# Patient Record
Sex: Male | Born: 1975 | Race: Black or African American | Hispanic: No | Marital: Married | Smoking: Never smoker
Health system: Southern US, Community
[De-identification: ages and names within clinical notes are randomized; demographics above are authoritative.]

## PROBLEM LIST (undated history)

## (undated) DIAGNOSIS — E119 Type 2 diabetes mellitus without complications: Secondary | ICD-10-CM

## (undated) DIAGNOSIS — I10 Essential (primary) hypertension: Secondary | ICD-10-CM

## (undated) DIAGNOSIS — E669 Obesity, unspecified: Secondary | ICD-10-CM

## (undated) HISTORY — PX: CHOLECYSTECTOMY: SHX55

---

## 2019-05-06 ENCOUNTER — Inpatient Hospital Stay
Admission: EM | Admit: 2019-05-06 | Discharge: 2019-05-16 | DRG: 308 | Disposition: A | Discharge status: Home / Self Care | Payer: BC Managed Care – PPO | Attending: Internal Medicine | Admitting: Internal Medicine

## 2019-05-06 ENCOUNTER — Emergency Department: Payer: BC Managed Care – PPO

## 2019-05-06 ENCOUNTER — Encounter: Payer: Self-pay | Admitting: Emergency Medicine

## 2019-05-06 ENCOUNTER — Ambulatory Visit
Admission: EM | Admit: 2019-05-06 | Discharge: 2019-05-06 | Disposition: A | Discharge status: Home / Self Care | Payer: BC Managed Care – PPO | Source: Home / Self Care | Attending: Family Medicine | Admitting: Family Medicine

## 2019-05-06 ENCOUNTER — Other Ambulatory Visit: Payer: Self-pay

## 2019-05-06 DIAGNOSIS — N170 Acute kidney failure with tubular necrosis: Secondary | ICD-10-CM | POA: Diagnosis not present

## 2019-05-06 DIAGNOSIS — B9620 Unspecified Escherichia coli [E. coli] as the cause of diseases classified elsewhere: Secondary | ICD-10-CM | POA: Diagnosis not present

## 2019-05-06 DIAGNOSIS — I959 Hypotension, unspecified: Secondary | ICD-10-CM | POA: Diagnosis not present

## 2019-05-06 DIAGNOSIS — Z6841 Body Mass Index (BMI) 40.0 and over, adult: Secondary | ICD-10-CM

## 2019-05-06 DIAGNOSIS — R079 Chest pain, unspecified: Secondary | ICD-10-CM | POA: Diagnosis present

## 2019-05-06 DIAGNOSIS — Z23 Encounter for immunization: Secondary | ICD-10-CM

## 2019-05-06 DIAGNOSIS — E785 Hyperlipidemia, unspecified: Secondary | ICD-10-CM | POA: Diagnosis present

## 2019-05-06 DIAGNOSIS — K5900 Constipation, unspecified: Secondary | ICD-10-CM | POA: Diagnosis not present

## 2019-05-06 DIAGNOSIS — R000 Tachycardia, unspecified: Secondary | ICD-10-CM

## 2019-05-06 DIAGNOSIS — I209 Angina pectoris, unspecified: Secondary | ICD-10-CM | POA: Diagnosis not present

## 2019-05-06 DIAGNOSIS — I5031 Acute diastolic (congestive) heart failure: Secondary | ICD-10-CM | POA: Diagnosis not present

## 2019-05-06 DIAGNOSIS — I5021 Acute systolic (congestive) heart failure: Secondary | ICD-10-CM | POA: Diagnosis present

## 2019-05-06 DIAGNOSIS — R778 Other specified abnormalities of plasma proteins: Secondary | ICD-10-CM | POA: Diagnosis not present

## 2019-05-06 DIAGNOSIS — Z20828 Contact with and (suspected) exposure to other viral communicable diseases: Secondary | ICD-10-CM | POA: Diagnosis present

## 2019-05-06 DIAGNOSIS — I5043 Acute on chronic combined systolic (congestive) and diastolic (congestive) heart failure: Secondary | ICD-10-CM | POA: Diagnosis present

## 2019-05-06 DIAGNOSIS — G44019 Episodic cluster headache, not intractable: Secondary | ICD-10-CM | POA: Diagnosis not present

## 2019-05-06 DIAGNOSIS — I10 Essential (primary) hypertension: Secondary | ICD-10-CM | POA: Diagnosis not present

## 2019-05-06 DIAGNOSIS — N182 Chronic kidney disease, stage 2 (mild): Secondary | ICD-10-CM | POA: Diagnosis not present

## 2019-05-06 DIAGNOSIS — J9601 Acute respiratory failure with hypoxia: Secondary | ICD-10-CM | POA: Diagnosis present

## 2019-05-06 DIAGNOSIS — Z79899 Other long term (current) drug therapy: Secondary | ICD-10-CM | POA: Diagnosis not present

## 2019-05-06 DIAGNOSIS — R519 Headache, unspecified: Secondary | ICD-10-CM | POA: Diagnosis present

## 2019-05-06 DIAGNOSIS — J9620 Acute and chronic respiratory failure, unspecified whether with hypoxia or hypercapnia: Secondary | ICD-10-CM | POA: Diagnosis not present

## 2019-05-06 DIAGNOSIS — Z823 Family history of stroke: Secondary | ICD-10-CM

## 2019-05-06 DIAGNOSIS — N390 Urinary tract infection, site not specified: Secondary | ICD-10-CM | POA: Diagnosis present

## 2019-05-06 DIAGNOSIS — G4733 Obstructive sleep apnea (adult) (pediatric): Secondary | ICD-10-CM | POA: Diagnosis present

## 2019-05-06 DIAGNOSIS — J9621 Acute and chronic respiratory failure with hypoxia: Secondary | ICD-10-CM | POA: Diagnosis present

## 2019-05-06 DIAGNOSIS — I4892 Unspecified atrial flutter: Secondary | ICD-10-CM | POA: Diagnosis not present

## 2019-05-06 DIAGNOSIS — R7989 Other specified abnormal findings of blood chemistry: Secondary | ICD-10-CM | POA: Diagnosis present

## 2019-05-06 DIAGNOSIS — R140 Abdominal distension (gaseous): Secondary | ICD-10-CM

## 2019-05-06 DIAGNOSIS — Z8249 Family history of ischemic heart disease and other diseases of the circulatory system: Secondary | ICD-10-CM

## 2019-05-06 DIAGNOSIS — I130 Hypertensive heart and chronic kidney disease with heart failure and stage 1 through stage 4 chronic kidney disease, or unspecified chronic kidney disease: Secondary | ICD-10-CM | POA: Diagnosis present

## 2019-05-06 DIAGNOSIS — I4891 Unspecified atrial fibrillation: Secondary | ICD-10-CM | POA: Diagnosis present

## 2019-05-06 DIAGNOSIS — N3000 Acute cystitis without hematuria: Secondary | ICD-10-CM | POA: Diagnosis not present

## 2019-05-06 DIAGNOSIS — B952 Enterococcus as the cause of diseases classified elsewhere: Secondary | ICD-10-CM | POA: Diagnosis not present

## 2019-05-06 DIAGNOSIS — R609 Edema, unspecified: Secondary | ICD-10-CM

## 2019-05-06 DIAGNOSIS — R0602 Shortness of breath: Secondary | ICD-10-CM

## 2019-05-06 DIAGNOSIS — K219 Gastro-esophageal reflux disease without esophagitis: Secondary | ICD-10-CM | POA: Diagnosis present

## 2019-05-06 DIAGNOSIS — N179 Acute kidney failure, unspecified: Secondary | ICD-10-CM | POA: Diagnosis not present

## 2019-05-06 DIAGNOSIS — J984 Other disorders of lung: Secondary | ICD-10-CM

## 2019-05-06 DIAGNOSIS — G44029 Chronic cluster headache, not intractable: Secondary | ICD-10-CM

## 2019-05-06 HISTORY — DX: Essential (primary) hypertension: I10

## 2019-05-06 HISTORY — DX: Obesity, unspecified: E66.9

## 2019-05-06 LAB — URINE DRUG SCREEN, QUALITATIVE (ARMC ONLY)
Amphetamines, Ur Screen: NOT DETECTED
Barbiturates, Ur Screen: NOT DETECTED
Benzodiazepine, Ur Scrn: NOT DETECTED
Cannabinoid 50 Ng, Ur ~~LOC~~: NOT DETECTED
Cocaine Metabolite,Ur ~~LOC~~: NOT DETECTED
MDMA (Ecstasy)Ur Screen: NOT DETECTED
Methadone Scn, Ur: NOT DETECTED
Opiate, Ur Screen: NOT DETECTED
Phencyclidine (PCP) Ur S: NOT DETECTED
Tricyclic, Ur Screen: NOT DETECTED

## 2019-05-06 LAB — BASIC METABOLIC PANEL
Anion gap: 11 (ref 5–15)
BUN: 18 mg/dL (ref 6–20)
CO2: 24 mmol/L (ref 22–32)
Calcium: 9 mg/dL (ref 8.9–10.3)
Chloride: 103 mmol/L (ref 98–111)
Creatinine, Ser: 1.32 mg/dL — ABNORMAL HIGH (ref 0.61–1.24)
GFR calc Af Amer: >60 mL/min (ref >60)
GFR calc non Af Amer: >60 mL/min (ref >60)
Glucose, Bld: 111 mg/dL — ABNORMAL HIGH (ref 70–99)
Potassium: 3.7 mmol/L (ref 3.5–5.1)
Sodium: 138 mmol/L (ref 135–145)

## 2019-05-06 LAB — CBC WITH DIFFERENTIAL/PLATELET
Abs Immature Granulocytes: 0.01 10*3/uL (ref 0.00–0.07)
Basophils Absolute: 0 10*3/uL (ref 0.0–0.1)
Basophils Relative: 0 %
Eosinophils Absolute: 0.1 10*3/uL (ref 0.0–0.5)
Eosinophils Relative: 3 %
HCT: 46 % (ref 39.0–52.0)
Hemoglobin: 14.7 g/dL (ref 13.0–17.0)
Immature Granulocytes: 0 %
Lymphocytes Relative: 29 %
Lymphs Abs: 1.3 10*3/uL (ref 0.7–4.0)
MCH: 29.2 pg (ref 26.0–34.0)
MCHC: 32 g/dL (ref 30.0–36.0)
MCV: 91.5 fL (ref 80.0–100.0)
Monocytes Absolute: 0.3 10*3/uL (ref 0.1–1.0)
Monocytes Relative: 6 %
Neutro Abs: 2.9 10*3/uL (ref 1.7–7.7)
Neutrophils Relative %: 62 %
Platelets: 211 10*3/uL (ref 150–400)
RBC: 5.03 MIL/uL (ref 4.22–5.81)
RDW: 12.1 % (ref 11.5–15.5)
WBC: 4.7 10*3/uL (ref 4.0–10.5)
nRBC: 0 % (ref 0.0–0.2)

## 2019-05-06 LAB — SARS CORONAVIRUS 2 BY RT PCR (HOSPITAL ORDER, PERFORMED IN ~~LOC~~ HOSPITAL LAB): SARS Coronavirus 2: NEGATIVE

## 2019-05-06 LAB — TSH: TSH: 2.001 u[IU]/mL (ref 0.350–4.500)

## 2019-05-06 LAB — PROTIME-INR
INR: 1.3 — ABNORMAL HIGH (ref 0.8–1.2)
Prothrombin Time: 15.9 seconds — ABNORMAL HIGH (ref 11.4–15.2)

## 2019-05-06 LAB — MAGNESIUM: Magnesium: 2 mg/dL (ref 1.7–2.4)

## 2019-05-06 LAB — APTT: aPTT: 132 seconds — ABNORMAL HIGH (ref 24–36)

## 2019-05-06 LAB — GLUCOSE, CAPILLARY: Glucose-Capillary: 96 mg/dL (ref 70–99)

## 2019-05-06 LAB — TROPONIN I (HIGH SENSITIVITY)
Troponin I (High Sensitivity): 63 ng/L — ABNORMAL HIGH (ref <18)
Troponin I (High Sensitivity): 62 ng/L — ABNORMAL HIGH (ref <18)

## 2019-05-06 LAB — MRSA PCR SCREENING: MRSA by PCR: NEGATIVE

## 2019-05-06 LAB — BRAIN NATRIURETIC PEPTIDE: B Natriuretic Peptide: 102 pg/mL — ABNORMAL HIGH (ref 0.0–100.0)

## 2019-05-06 MED ORDER — HEPARIN BOLUS VIA INFUSION
5000.0000 [IU] | Freq: Once | INTRAVENOUS | Status: AC
  Administered 2019-05-06: 5000 [IU] via INTRAVENOUS
  Filled 2019-05-06: qty 5000

## 2019-05-06 MED ORDER — ACETAMINOPHEN 650 MG RE SUPP: 650.0000 mg | Freq: Four times a day (QID) | RECTAL | Status: DC | PRN

## 2019-05-06 MED ORDER — INFLUENZA VAC SPLIT QUAD 0.5 ML IM SUSY
0.5000 mL | PREFILLED_SYRINGE | INTRAMUSCULAR | Status: AC
  Administered 2019-05-07: 0.5 mL via INTRAMUSCULAR
  Filled 2019-05-06: qty 0.5

## 2019-05-06 MED ORDER — ONDANSETRON HCL 4 MG PO TABS: 4.0000 mg | ORAL_TABLET | Freq: Four times a day (QID) | ORAL | Status: DC | PRN

## 2019-05-06 MED ORDER — DILTIAZEM HCL 25 MG/5ML IV SOLN
10.0000 mg | Freq: Once | INTRAVENOUS | Status: AC
  Administered 2019-05-06: 15:00:00 10 mg via INTRAVENOUS

## 2019-05-06 MED ORDER — DILTIAZEM HCL 25 MG/5ML IV SOLN
20.0000 mg | Freq: Once | INTRAVENOUS | Status: AC
  Administered 2019-05-06: 17:00:00 20 mg via INTRAVENOUS

## 2019-05-06 MED ORDER — ACETAMINOPHEN 325 MG PO TABS: 650.0000 mg | ORAL_TABLET | Freq: Four times a day (QID) | ORAL | Status: DC | PRN

## 2019-05-06 MED ORDER — DILTIAZEM HCL 100 MG IV SOLR: 5.0000 mg/h | INTRAVENOUS | Status: DC

## 2019-05-06 MED ORDER — DILTIAZEM HCL 25 MG/5ML IV SOLN: 10.0000 mg | Freq: Once | INTRAVENOUS | Status: DC

## 2019-05-06 MED ORDER — DILTIAZEM HCL 25 MG/5ML IV SOLN
5.0000 mg | Freq: Once | INTRAVENOUS | Status: AC
  Administered 2019-05-06: 5 mg via INTRAVENOUS
  Filled 2019-05-06: qty 5

## 2019-05-06 MED ORDER — DILTIAZEM HCL 100 MG IV SOLR
5.0000 mg/h | INTRAVENOUS | Status: DC
  Administered 2019-05-06: 15 mg/h via INTRAVENOUS
  Administered 2019-05-06: 5 mg/h via INTRAVENOUS
  Administered 2019-05-07: 7.5 mg/h via INTRAVENOUS
  Administered 2019-05-07 (×2): 15 mg/h via INTRAVENOUS
  Administered 2019-05-08: 5 mg/h via INTRAVENOUS
  Filled 2019-05-06 (×6): qty 100

## 2019-05-06 MED ORDER — LOSARTAN POTASSIUM 50 MG PO TABS
50.0000 mg | ORAL_TABLET | Freq: Every day | ORAL | Status: DC
  Administered 2019-05-06 – 2019-05-09 (×4): 50 mg via ORAL
  Filled 2019-05-06 (×4): qty 1

## 2019-05-06 MED ORDER — ONDANSETRON HCL 4 MG/2ML IJ SOLN
4.0000 mg | Freq: Four times a day (QID) | INTRAMUSCULAR | Status: DC | PRN
  Administered 2019-05-08 – 2019-05-09 (×2): 4 mg via INTRAVENOUS
  Filled 2019-05-06 (×2): qty 2

## 2019-05-06 MED ORDER — HYDROCHLOROTHIAZIDE 25 MG PO TABS
25.0000 mg | ORAL_TABLET | Freq: Every day | ORAL | Status: DC
  Administered 2019-05-06 – 2019-05-09 (×4): 25 mg via ORAL
  Filled 2019-05-06 (×4): qty 1

## 2019-05-06 MED ORDER — METOPROLOL TARTRATE 5 MG/5ML IV SOLN
5.0000 mg | Freq: Four times a day (QID) | INTRAVENOUS | Status: DC | PRN
  Administered 2019-05-06 – 2019-05-09 (×3): 5 mg via INTRAVENOUS
  Filled 2019-05-06 (×3): qty 5

## 2019-05-06 MED ORDER — SODIUM CHLORIDE 0.9% FLUSH
3.0000 mL | Freq: Two times a day (BID) | INTRAVENOUS | Status: DC
  Administered 2019-05-06 – 2019-05-16 (×16): 3 mL via INTRAVENOUS

## 2019-05-06 MED ORDER — AMLODIPINE BESYLATE 5 MG PO TABS
5.0000 mg | ORAL_TABLET | Freq: Every day | ORAL | Status: DC
  Administered 2019-05-06 – 2019-05-09 (×4): 5 mg via ORAL
  Filled 2019-05-06 (×4): qty 1

## 2019-05-06 MED ORDER — ASPIRIN 81 MG PO CHEW
324.0000 mg | CHEWABLE_TABLET | Freq: Once | ORAL | Status: AC
  Administered 2019-05-06: 324 mg via ORAL

## 2019-05-06 MED ORDER — POLYETHYLENE GLYCOL 3350 17 G PO PACK
17.0000 g | PACK | Freq: Every day | ORAL | Status: DC | PRN
  Administered 2019-05-09 (×2): 17 g via ORAL
  Filled 2019-05-06 (×2): qty 1

## 2019-05-06 MED ORDER — HEPARIN (PORCINE) 25000 UT/250ML-% IV SOLN
2000.0000 [IU]/h | INTRAVENOUS | Status: DC
  Administered 2019-05-06 – 2019-05-08 (×4): 2000 [IU]/h via INTRAVENOUS
  Filled 2019-05-06 (×4): qty 250

## 2019-05-06 NOTE — ED Triage Notes (Signed)
Pt to ED via [facility] from urgent care for chief complaint of cluster headaches, tachycardia and concerning EKG.  C/o [facility] [place] that started this past weekend with exertion and h/a that started Friday. Pt appears in NAD at this time.

## 2019-05-06 NOTE — ED Provider Notes (Signed)
[facility] Emergency Department Provider Note ____________________________________________   First MD Initiated Contact with Patient 05/06/19 1420     (approximate)  I have reviewed the triage vital signs and the nursing notes.   HISTORY  Chief Complaint Headache, Tachycardia, and Shortness of Breath    HPI Joshua Sampson is a 43 y.o. male with PMH as noted below who presents from urgent care after he was noted to be significantly tachycardic and and likely atrial flutter.  The patient states he initially went to the urgent care because of cluster headaches which have been happening about every 1-2 hours and lasting for a few minutes.  He states he has had episodes like this about every 2 years, and it feels similar to prior cluster headaches.  It is mainly in the right frontotemporal region.  However, in urgent care the patient was noted to have a heart rate in the 140s and an EKG showing atrial flutter and possible MI.  The patient states that he has had some palpitations intermittently over the last few days.  He had some chest pressure when laying down yesterday, but none currently.  He has no active chest pain at this time.  He states he has had exertional shortness of breath for some time.  She denies any leg swelling.  He has no prior history of atrial fibrillation or flutter.   Past Medical History:  Diagnosis Date  . Hypertension   . Obesity     There are no active problems to display for this patient.   Past Surgical History:  Procedure Laterality Date  . CHOLECYSTECTOMY      Prior to Admission medications   Medication Sig Start Date End Date Taking? Authorizing Provider  amLODipine (NORVASC) 5 MG tablet Take 5 mg by mouth daily. 04/26/19   [provider]  hydrochlorothiazide (HYDRODIURIL) 25 MG tablet TAKE 1 TABLET BY MOUTH DAILY 08/30/18   [provider]  losartan (COZAAR) 50 MG tablet Take by mouth. 09/02/17   [provider]    Allergies Patient has no known allergies.  Family History  Problem Relation Age of Onset  . Hypertension Mother   . Hypertension Father   . Stroke Brother     Social History Social History   Tobacco Use  . Smoking status: Never Smoker  . Smokeless tobacco: Never Used  Substance Use Topics  . Alcohol use: Not Currently  . Drug use: Never    Review of Systems  Constitutional: No fever. Eyes: No visual changes. ENT: No sore throat. Cardiovascular: Denies chest pain. Respiratory: Positive for intermittent shortness of breath. Gastrointestinal: No vomiting or diarrhea.  Genitourinary: Negative for flank pain. Musculoskeletal: Negative for back pain. Skin: Negative for rash. Neurological: Positive for intermittent headache.   ____________________________________________   PHYSICAL EXAM:  VITAL SIGNS: ED Triage Vitals  Enc Vitals Group     BP --      Pulse Rate 05/06/19 1424 (!) 148     Resp 05/06/19 1424 16     Temp 05/06/19 1424 98.1 F (36.7 C)     Temp Source 05/06/19 1424 Oral     SpO2 05/06/19 1424 97 %     Weight 05/06/19 1427 (!) 489 lb 3.2 oz (221.9 kg)     Height 05/06/19 1427 5\' 11"  (1.803 m)     Head Circumference --      Peak Flow --      Pain Score 05/06/19 1429 0  Pain Loc --      Pain Edu? --      Excl. in [place]? --     Constitutional: Alert and oriented.  Relatively well appearing and in no acute distress. Eyes: Conjunctivae are normal.  Head: Atraumatic. Nose: No congestion/rhinnorhea. Mouth/Throat: Mucous membranes are moist.   Neck: Normal range of motion.  Cardiovascular: Tachycardic, regular rhythm. Grossly normal heart sounds.  Good peripheral circulation. Respiratory: Normal respiratory effort.  No retractions. Lungs CTAB. Gastrointestinal:  No distention.  Musculoskeletal: 1+ bilateral lower extremity edema.  No calf or popliteal swelling or tenderness.  Extremities warm and well perfused.  Neurologic:   Normal speech and language. No gross focal neurologic deficits are appreciated.  Skin:  Skin is warm and dry. No rash noted. Psychiatric: Mood and affect are normal. Speech and behavior are normal.  ____________________________________________   LABS (all labs ordered are listed, but only abnormal results are displayed)  Labs Reviewed  CBC WITH DIFFERENTIAL/PLATELET  BASIC METABOLIC PANEL  BRAIN NATRIURETIC PEPTIDE  TROPONIN I (HIGH SENSITIVITY)   ____________________________________________  EKG  ED ECG REPORT I, Arta Silence, the attending physician, personally viewed and interpreted this ECG.  Date: 05/06/2019 EKG Time: 1422 Rate: 148 Rhythm: Atrial tachycardia versus atrial flutter QRS Axis: normal Intervals: Prolonged QTc ST/T Wave abnormalities: Nonspecific inferior ST abnormality Narrative Interpretation: Likely atrial flutter with nonspecific inferior and anterior ST abnormality, possible acute ischemia  ____________________________________________  RADIOLOGY  CXR: Pending  ____________________________________________   PROCEDURES  Procedure(s) performed: No  Procedures  Critical Care performed: Yes  CRITICAL CARE Performed by: Arta Silence   Total critical care time: 30 minutes  Critical care time was exclusive of separately billable procedures and treating other patients.  Critical care was necessary to treat or prevent imminent or life-threatening deterioration.  Critical care was time spent personally by me on the following activities: development of treatment plan with patient and/or surrogate as well as nursing, discussions with consultants, evaluation of patient's response to treatment, examination of patient, obtaining history from patient or surrogate, ordering and performing treatments and interventions, ordering and review of laboratory studies, ordering and review of radiographic studies, pulse oximetry and re-evaluation of  patient's condition. ____________________________________________   INITIAL IMPRESSION / ASSESSMENT AND PLAN / ED COURSE  Pertinent labs & imaging results that were available during my care of the patient were reviewed by me and considered in my medical decision making (see chart for details).  43 year old male with PMH as noted above including hypertension and cluster headaches presents referred from urgent care after he was found to be tachycardic to the 140s and likely atrial flutter.  The patient has no prior history of this.  He reports intermittent headaches every 1-2 hours consistent with prior cluster headaches.  He also has had exertional shortness of breath for some time, and more consistent palpitations over the last few days.  He denies any acute chest pain at this time.  On exam, the patient is relatively comfortable appearing.  He is tachycardic to the 140s.  His other vital signs are normal.  He is obese.  He has no marked lower extremity edema and the remainder of the exam is unremarkable.  EKG shows a rhythm consistent with atrial flutter in the 140s and findings concerning for possible inferior ischemia, although not meeting STEMI criteria.  Overall presentation is most consistent with new onset atrial flutter.  The patient has been placed on the monitor.  We will give IV diltiazem for rate control, obtain  a chest x-ray, lab work-up, and reassess.  I have paged Dr. Juliann Paresallwood to review the EKG.  ----------------------------------------- 2:46 PM on 05/06/2019 -----------------------------------------  I discussed the case with Dr. Juliann Paresallwood who is reviewed the EKGs and agrees that the patient does not meet STEMI criteria.  We will continue with the management as above.  ----------------------------------------- 3:16 PM on 05/06/2019 -----------------------------------------  Patient had minimal response to initial dose of IV Cardizem.  We will give 10 mg and consider infusion  if needed.  The patient is pending the remainder of his work-up.  I am signing him out to the oncoming physician Dr. Colon BranchMonks.  ____________________________________________   FINAL CLINICAL IMPRESSION(S) / ED DIAGNOSES  Final diagnoses:  Atrial flutter, unspecified type (HCC)  Episodic cluster headache, not intractable      NEW MEDICATIONS STARTED DURING THIS VISIT:  New Prescriptions   No medications on file     Note:  This document was prepared using Dragon voice recognition software and may include unintentional dictation errors.    Dionne BucySiadecki, Keygan Dumond, MD 05/06/19 [phone]

## 2019-05-06 NOTE — H&P (Signed)
History and Physical  Joshua Sampson VOH:[id number] DOB: 1976/04/08 DOA: 05/06/2019  Referring physician: Dr. Colon Branch, ER physician PCP: System, Pcp Not In  Outpatient Specialists: None Patient coming from: Home & is able to ambulate without assistance  Chief Complaint: Headache  HPI: Joshua Sampson is a 43 y.o. male with medical history significant for morbid obesity, hypertension and cluster headaches.  Patient states he is overall been doing well.  Through keto diet, he has lost 10 to 15 pounds lately intentionally.  He says in the last couple days has not been sleeping great, but overall he sleeps okay.  He is unsure if he snores, sleeps about 5 to 6 hours a night and says he does feel well rested.  Started having a cluster headache 3 days ago.  Intermittent, lasting about 10 minutes, but he had several repeatedly for the past few days including today so he came into the urgent care.  Prior to coming in, patient said he felt some mild chest palpitations and a little short of breath, but that quickly resolved.  ED Course: In the urgent care, he was found to be tachycardic and in rapid atrial flutter.  Transferred over to the emergency room.  Cardiology notified.  Patient was given a bolus of 5 mg, then 10 mg Cardizem and then a Cardizem drip, however heart remained in the 150s.  Hospitalist were consulted and ordered a another bolus at 20 mg.  Review of Systems: Patient seen in the emergency room. Pt complains of a little bit of palpitations and feeling tired.  His cluster headaches have resolved  Pt denies any headache, vision changes, dysphagia, current chest pain, shortness of breath, wheeze, cough, abdominal pain, hematuria, dysuria, constipation, diarrhea, focal extremity numbness weakness or pain.  Review of systems are otherwise negative   Past Medical History:  Diagnosis Date  . Hypertension   . Obesity    Past Surgical History:  Procedure Laterality Date  . CHOLECYSTECTOMY       Social History:  reports that he has never smoked. He has never used smokeless tobacco. He reports previous alcohol use. He reports that he does not use drugs.  Lives at home with his wife, ambulates without assistance   No Known Allergies  Family History  Problem Relation Age of Onset  . Hypertension Mother   . Hypertension Father   . Stroke Brother       Prior to Admission medications   Medication Sig Start Date End Date Taking? Authorizing Provider  amLODipine (NORVASC) 5 MG tablet Take 5 mg by mouth daily. 04/26/19   [provider]  hydrochlorothiazide (HYDRODIURIL) 25 MG tablet TAKE 1 TABLET BY MOUTH DAILY 08/30/18   [provider]  losartan (COZAAR) 50 MG tablet Take by mouth. 09/02/17   [provider]    Physical Exam: BP (!) 164/114   Pulse (!) 119   Temp 98.1 F (36.7 C) (Oral)   Resp (!) 23   Ht 5\' 11"  (1.803 m)   Wt (!) 221.9 kg   SpO2 96%   BMI 68.23 kg/m   General: Alert and oriented x3, no acute distress Eyes: Sclera nonicteric, extraocular movements are intact ENT: Normocephalic and atraumatic, mucous membranes are moist Neck: Thick, narrow airway Cardiovascular: Tachycardic, regular Respiratory: Clear to auscultation bilaterally Abdomen: Soft, nontender, nondistended, positive bowel sounds Skin: No skin breaks, tears or lesions Musculoskeletal: No clubbing or cyanosis, trace to 1+ pitting edema bilaterally Psychiatric: Appropriate, no evidence of psychoses Neurologic: No focal deficits  Labs on Admission:  Basic Metabolic Panel: Recent Labs  Lab 05/06/19 1500  NA 138  K 3.7  CL 103  CO2 24  GLUCOSE 111*  BUN 18  CREATININE 1.32*  CALCIUM 9.0   Liver Function Tests: No results for input(s): AST, ALT, ALKPHOS, BILITOT, PROT, ALBUMIN in the last 168 hours. No results for input(s): LIPASE, AMYLASE in the last 168 hours. No results for input(s): AMMONIA in the last 168 hours. CBC: Recent Labs  Lab  05/06/19 1500  WBC 4.7  NEUTROABS 2.9  HGB 14.7  HCT 46.0  MCV 91.5  PLT 211   Cardiac Enzymes: No results for input(s): CKTOTAL, CKMB, CKMBINDEX, TROPONINI in the last 168 hours.  BNP (last 3 results) Recent Labs    05/06/19 1500  BNP 102.0*    ProBNP (last 3 results) No results for input(s): PROBNP in the last 8760 hours.  CBG: No results for input(s): GLUCAP in the last 168 hours.  Radiological Exams on Admission: Dg Chest Portable 1 View  Result Date: 05/06/2019 CLINICAL DATA:  Tachycardia. Shortness of breath. Palpitations. Abnormal EKG. EXAM: PORTABLE CHEST 1 VIEW COMPARISON:  None. FINDINGS: Mild to moderate cardiomegaly seen. Pulmonary vascular congestion and probable mild interstitial edema noted. No evidence of pulmonary consolidation or pleural effusion. IMPRESSION: Cardiomegaly and pulmonary vascular congestion, with probable mild interstitial edema. Electronically Signed   By: Marlaine Hind M.D.   On: 05/06/2019 15:30    EKG: Independently reviewed.  Atrial flutter with 2-1 conduction, heart rate of 148  Assessment/Plan Present on Admission: . Morbid obesity [facility]): Patient meets criteria BMI greater than 40.  Marland Kitchen Hypertension: We will continue home medications.  . Atrial flutter [facility]): New finding.  I do suspect the patient may have some underlying sleep apnea which may be an underlying cause.  Patient denies any caffeine or energy drinks checking echocardiogram.  Cardiology aware of patient.  In the meantime, on Cardizem drip and hopefully will respond to this.  If not, have added as needed IV Lopressor.    Marland Kitchen Headache, cluster: Intermittent and chronic.  Currently stable and no acute symptoms.  . CKD (chronic kidney disease), stage II: No previous labs to compare.  Looks slightly volume overloaded not dry.  Suspect he does have some mild underlying disease, will continue to follow  Principal Problem:   Atrial flutter ([place]) Active Problems:   Morbid obesity  ([place])   Hypertension   Headache   CKD (chronic kidney disease), stage II   DVT prophylaxis: Full dose heparin  Code Status: Full code  Family Communication: Left message for wife  Disposition Plan: Continue in stepdown until atrial flutter resolved  Consults called: Cardiology-[place]  Admission status: Given need for acute hospital services and expect will be her past 2 midnights, admitting as inpatient    Annita Brod MD [facility] Pager [phone]  If 7PM-7AM, please contact night-coverage [email or web address] Password [facility]  05/06/2019, 6:06 PM

## 2019-05-06 NOTE — Consult Note (Signed)
ANTICOAGULATION CONSULT NOTE - Initial Consult  Pharmacy Consult for Heparin Infusion Indication: atrial fibrillation  No Known Allergies  Patient Measurements: Height: 5\' 11"  (180.3 cm) Weight: (!) 489 lb 3.2 oz (221.9 kg) IBW/kg (Calculated) : 75.3 Heparin Dosing Weight: 132.5 kg  Vital Signs: Temp: 97.6 F (36.4 C) (11/01 1813) Temp Source: Oral (11/01 1813) BP: 126/89 (11/01 1900) Pulse Rate: 29 (11/01 1900)  Labs: Recent Labs    05/06/19 1500  HGB 14.7  HCT 46.0  PLT 211  CREATININE 1.32*  TROPONINIHS 63*    Estimated Creatinine Clearance: 133.8 mL/min (A) (by C-G formula based on SCr of 1.32 mg/dL (H)).   Medical History: Past Medical History:  Diagnosis Date  . Hypertension   . Obesity     Medications:  Medications Prior to Admission  Medication Sig Dispense Refill Last Dose  . amLODipine (NORVASC) 5 MG tablet Take 5 mg by mouth daily.     . hydrochlorothiazide (HYDRODIURIL) 25 MG tablet TAKE 1 TABLET BY MOUTH DAILY     . losartan (COZAAR) 50 MG tablet Take by mouth.      Scheduled:  . amLODipine  5 mg Oral Daily  . hydrochlorothiazide  25 mg Oral Daily  . [START ON 05/07/2019] influenza vac split quadrivalent PF  0.5 mL Intramuscular Tomorrow-1000  . losartan  50 mg Oral Daily  . sodium chloride flush  3 mL Intravenous Q12H   Infusions:  . diltiazem (CARDIZEM) infusion 15 mg/hr (05/06/19 1841)  . heparin 2,000 Units/hr (05/06/19 1838)   PRN: acetaminophen **OR** acetaminophen, metoprolol tartrate, ondansetron **OR** ondansetron (ZOFRAN) IV, polyethylene glycol Anti-infectives (From admission, onward)   None      Assessment: Pharmacy has been consulted to initiate Heparin Infusion in 43yo patient with new diagnosis of Atrial fibrillation. Patient has no prior history of anticoagulant use. Baseline labs were ordered but unable to be obtained prior to initiation of heparin infusion.   Goal of Therapy:  Heparin level 0.3-0.7 units/ml Monitor  platelets by anticoagulation protocol: Yes   Plan:  Give 5000 units bolus x 1 Start heparin infusion at 2000 units/hr Check anti-Xa level in 6 hours and daily while on heparin Continue to monitor H&H and platelets  Ciani Rutten A Deidrea Gaetz 05/06/2019,7:13 PM

## 2019-05-06 NOTE — ED Provider Notes (Addendum)
[facility]    CSN: [id number] Arrival date & time: 05/06/19  1250  History   Chief Complaint Chief Complaint  Patient presents with  . Headache   HPI  43 year old male presents with headache.  Patient states that he suffers from cluster headaches.  Has been having cluster headaches since Friday.  No medications or interventions tried.  Last for minutes and then subsequently resolve.  Patient reports that he has had some feelings of chest tightness or pressure and associated palpitations as well.  Reports ongoing increasing shortness of breath over the past week.  Family history notable for heart disease.  No fever. No other associated symptoms. No other complaints at this time.  PMH, Surgical Hx, Family Hx, Social History reviewed and updated as below.  PMH: Morbid obesity, HTN  Past Surgical History:  Procedure Laterality Date  . CHOLECYSTECTOMY     Home Medications    Prior to Admission medications   Medication Sig Start Date End Date Taking? Authorizing Provider  amLODipine (NORVASC) 5 MG tablet Take 5 mg by mouth daily. 04/26/19  Yes [provider]  hydrochlorothiazide (HYDRODIURIL) 25 MG tablet TAKE 1 TABLET BY MOUTH DAILY 08/30/18  Yes [provider]  losartan (COZAAR) 50 MG tablet Take by mouth. 09/02/17  Yes [provider]   Family History Heart disease Brother  CHF  Stroke Brother  age 43  Heart disease Father  MI age 66s  Hypertension Father    Hypertension Mother     Social History Social History   Tobacco Use  . Smoking status: Never Smoker  . Smokeless tobacco: Never Used  Substance Use Topics  . Alcohol use: Not Currently  . Drug use: Never   Allergies   Patient has no known allergies.   Review of Systems Review of Systems  Constitutional: Negative.   Respiratory: Positive for shortness of breath.   Cardiovascular: Positive for chest pain and palpitations.  Neurological: Positive for headaches.   All other systems reviewed and are negative.  Physical Exam Triage Vital Signs ED Triage Vitals  Enc Vitals Group     BP 05/06/19 1309 (!) 156/99     Pulse Rate 05/06/19 1309 (!) 149     Resp 05/06/19 1309 18     Temp 05/06/19 1309 97.8 F (36.6 C)     Temp Source 05/06/19 1309 Oral     SpO2 05/06/19 1309 98 %     Weight --      Height 05/06/19 1306 5\' 11"  (1.803 m)     Head Circumference --      Peak Flow --      Pain Score 05/06/19 1306 0     Pain Loc --      Pain Edu? --      Excl. in [place]? --    Updated Vital Signs BP (!) 156/99 (BP Location: Left Arm)   Pulse (!) 149   Temp 97.8 F (36.6 C) (Oral)   Resp 18   Ht 5\' 11"  (1.803 m)   SpO2 98%   Visual Acuity Right Eye Distance:   Left Eye Distance:   Bilateral Distance:    Right Eye Near:   Left Eye Near:    Bilateral Near:     Physical Exam Vitals signs and nursing note reviewed.  Constitutional:      Appearance: Normal appearance.     Comments: Morbidly obese male in no acute distress.  HENT:     Head: Normocephalic and atraumatic.  Eyes:     General:        Right eye: No discharge.        Left eye: No discharge.     Conjunctiva/sclera: Conjunctivae normal.  Cardiovascular:     Rate and Rhythm: Regular rhythm.     Heart sounds: No murmur.     Comments: Tachycardic. Pulmonary:     Effort: Pulmonary effort is normal.     Breath sounds: Normal breath sounds. No wheezing or rales.  Abdominal:     General: There is no distension.     Palpations: Abdomen is soft.     Tenderness: There is no abdominal tenderness.  Skin:    General: Skin is warm.     Findings: No rash.  Neurological:     General: No focal deficit present.     Mental Status: He is alert and oriented to person, place, and time.  Psychiatric:        Mood and Affect: Mood normal.        Behavior: Behavior normal.    UC Treatments / Results  Labs (all labs ordered are listed, but only abnormal results are displayed) Labs Reviewed -  No data to display  EKG Interpretation: ? Atrial flutter.  Rate 148. ST elevation noted in lead I and aVL.  Radiology No results found.  Procedures Procedures (including critical care time)  Medications Ordered in UC Medications  aspirin chewable tablet 324 mg (324 mg Oral Given 05/06/19 1318)    Initial Impression / Assessment and Plan / UC Course  I have reviewed the triage vital signs and the nursing notes.  Pertinent labs & imaging results that were available during my care of the patient were reviewed by me and considered in my medical decision making (see chart for details).    43 year old male presents with ongoing cluster headaches.  Upon presentation, noted to be tachycardic.  EKG obtained and concerning.  ST elevation noted in 1 and aVL.  Repeat EKG concerning for atrial flutter.  Aspirin given.  IV placed.  EMS contacted.  Patient being transported directly to the hospital.  Hospital was alerted.  Final Clinical Impressions(s) / UC Diagnoses   Final diagnoses:  Tachycardia  Chest pain, unspecified type  Chronic cluster headache, not intractable   Discharge Instructions   None    ED Prescriptions    None     PDMP not reviewed this encounter.   Coral Spikes, DO 05/06/19 1401    [place], DO 05/06/19 1402

## 2019-05-06 NOTE — ED Triage Notes (Signed)
Patient c/o cluster HAs that started on Friday.  Patient states that he has not taken anything for his pain.

## 2019-05-06 NOTE — ED Notes (Signed)
Report called to Theadora Rama, Warehouse manager at [facility] ED.

## 2019-05-07 ENCOUNTER — Inpatient Hospital Stay
Admit: 2019-05-07 | Discharge: 2019-05-07 | Disposition: A | Discharge status: Home / Self Care | Payer: BC Managed Care – PPO | Attending: Internal Medicine | Admitting: Internal Medicine

## 2019-05-07 ENCOUNTER — Encounter: Payer: Self-pay | Admitting: Internal Medicine

## 2019-05-07 LAB — CBC
HCT: 47.3 % (ref 39.0–52.0)
Hemoglobin: 15.5 g/dL (ref 13.0–17.0)
MCH: 29.4 pg (ref 26.0–34.0)
MCHC: 32.8 g/dL (ref 30.0–36.0)
MCV: 89.8 fL (ref 80.0–100.0)
Platelets: 205 10*3/uL (ref 150–400)
RBC: 5.27 MIL/uL (ref 4.22–5.81)
RDW: 12.5 % (ref 11.5–15.5)
WBC: 5.2 10*3/uL (ref 4.0–10.5)
nRBC: 0 % (ref 0.0–0.2)

## 2019-05-07 LAB — HEPARIN LEVEL (UNFRACTIONATED)
Heparin Unfractionated: 0.53 IU/mL (ref 0.30–0.70)
Heparin Unfractionated: 0.59 IU/mL (ref 0.30–0.70)

## 2019-05-07 LAB — HIV ANTIBODY (ROUTINE TESTING W REFLEX): HIV Screen 4th Generation wRfx: NONREACTIVE

## 2019-05-07 MED ORDER — CHLORHEXIDINE GLUCONATE CLOTH 2 % EX PADS
6.0000 | MEDICATED_PAD | Freq: Every day | CUTANEOUS | Status: DC
  Administered 2019-05-07 – 2019-05-09 (×2): 6 via TOPICAL

## 2019-05-07 NOTE — Consult Note (Signed)
ANTICOAGULATION CONSULT NOTE   Pharmacy Consult for Heparin Infusion Indication: atrial fibrillation  No Known Allergies  Patient Measurements: Height: 5\' 11"  (180.3 cm) Weight: (!) 403 lb (182.8 kg) IBW/kg (Calculated) : 75.3 Heparin Dosing Weight: 132.5 kg  Vital Signs: Temp: 97.5 F (36.4 C) (11/02 1400) Temp Source: Oral (11/02 1400) BP: 115/77 (11/02 1400) Pulse Rate: 53 (11/02 1400)  Labs: Recent Labs    05/06/19 1500 05/06/19 1851 05/06/19 1935 05/07/19 0030 05/07/19 0657  HGB 14.7  --   --   --  15.5  HCT 46.0  --   --   --  47.3  PLT 211  --   --   --  205  APTT  --   --  132*  --   --   LABPROT  --   --  15.9*  --   --   INR  --   --  1.3*  --   --   HEPARINUNFRC  --   --   --  0.59 0.53  CREATININE 1.32*  --   --   --   --   TROPONINIHS 63* 62*  --   --   --     Estimated Creatinine Clearance: 118.3 mL/min (A) (by C-G formula based on SCr of 1.32 mg/dL (H)).   Medical History: Past Medical History:  Diagnosis Date  . Hypertension   . Obesity     Medications:  Medications Prior to Admission  Medication Sig Dispense Refill Last Dose  . amLODipine (NORVASC) 5 MG tablet Take 5 mg by mouth daily.     . hydrochlorothiazide (HYDRODIURIL) 25 MG tablet TAKE 1 TABLET BY MOUTH DAILY     . losartan (COZAAR) 50 MG tablet Take by mouth.      Scheduled:  . amLODipine  5 mg Oral Daily  . Chlorhexidine Gluconate Cloth  6 each Topical Daily  . hydrochlorothiazide  25 mg Oral Daily  . losartan  50 mg Oral Daily  . sodium chloride flush  3 mL Intravenous Q12H   Infusions:  . diltiazem (CARDIZEM) infusion 10 mg/hr (05/07/19 1300)  . heparin 2,000 Units/hr (05/07/19 1300)   PRN: acetaminophen **OR** acetaminophen, metoprolol tartrate, ondansetron **OR** ondansetron (ZOFRAN) IV, polyethylene glycol  Assessment: Heparin dose is appropriate given two heparin levels within goal range.  Goal of Therapy:  Heparin level 0.3-0.7 units/ml Monitor platelets by  anticoagulation protocol: Yes   Plan:  - Continue heparin infusion at 2000 units/hr. - Check anti-Xa level daily while on heparin. - Continue to monitor H&H and platelets.   Raiford Simmonds, PharmD Candidate 05/07/2019,2:43 PM

## 2019-05-07 NOTE — Progress Notes (Signed)
Remains on Cardizem drip with HR in the 100-120. Pt is stable otherwise. Wife currently at bedside and pt resting comfortably.

## 2019-05-07 NOTE — Consult Note (Signed)
ANTICOAGULATION CONSULT NOTE   Pharmacy Consult for Heparin Infusion Indication: atrial fibrillation  No Known Allergies  Patient Measurements: Height: 5\' 11"  (180.3 cm) Weight: (!) 489 lb 3.2 oz (221.9 kg) IBW/kg (Calculated) : 75.3 Heparin Dosing Weight: 132.5 kg  Vital Signs: Temp: 97.8 F (36.6 C) (11/01 2000) Temp Source: Oral (11/01 2000) BP: 113/84 (11/02 0000) Pulse Rate: 58 (11/02 0000)  Labs: Recent Labs    05/06/19 1500 05/06/19 1851 05/06/19 1935 05/07/19 0030  HGB 14.7  --   --   --   HCT 46.0  --   --   --   PLT 211  --   --   --   APTT  --   --  132*  --   LABPROT  --   --  15.9*  --   INR  --   --  1.3*  --   HEPARINUNFRC  --   --   --  0.59  CREATININE 1.32*  --   --   --   TROPONINIHS 63* 62*  --   --     Estimated Creatinine Clearance: 133.8 mL/min (A) (by C-G formula based on SCr of 1.32 mg/dL (H)).   Medical History: Past Medical History:  Diagnosis Date  . Hypertension   . Obesity     Medications:  Medications Prior to Admission  Medication Sig Dispense Refill Last Dose  . amLODipine (NORVASC) 5 MG tablet Take 5 mg by mouth daily.     . hydrochlorothiazide (HYDRODIURIL) 25 MG tablet TAKE 1 TABLET BY MOUTH DAILY     . losartan (COZAAR) 50 MG tablet Take by mouth.      Scheduled:  . amLODipine  5 mg Oral Daily  . hydrochlorothiazide  25 mg Oral Daily  . influenza vac split quadrivalent PF  0.5 mL Intramuscular Tomorrow-1000  . losartan  50 mg Oral Daily  . sodium chloride flush  3 mL Intravenous Q12H   Infusions:  . diltiazem (CARDIZEM) infusion 15 mg/hr (05/07/19 0000)  . heparin 2,000 Units/hr (05/07/19 0000)   PRN: acetaminophen **OR** acetaminophen, metoprolol tartrate, ondansetron **OR** ondansetron (ZOFRAN) IV, polyethylene glycol Anti-infectives (From admission, onward)   None      Assessment: Pharmacy has been consulted to initiate Heparin Infusion in 43yo patient with new diagnosis of Atrial fibrillation. Patient  has no prior history of anticoagulant use. Baseline labs were ordered but unable to be obtained prior to initiation of heparin infusion.   11/2 @ 0030 HL = 0.59, therapeutic x 1, continue current rate and recheck in am to confirm  Goal of Therapy:  Heparin level 0.3-0.7 units/ml Monitor platelets by anticoagulation protocol: Yes   Plan:  Give 5000 units bolus x 1 Start heparin infusion at 2000 units/hr Check anti-Xa level in 6 hours and daily while on heparin Continue to monitor H&H and platelets  [place] Crane, Vashawn Ekstein A 05/07/2019,1:08 AM

## 2019-05-07 NOTE — Progress Notes (Addendum)
Progress Note    Joshua Sampson  JSH:[id number] DOB: 1976-06-02  DOA: 05/06/2019 PCP: System, Pcp Not In     Assessment/Plan:   Principal Problem:   Atrial flutter (HCC) Active Problems:   Morbid obesity (HCC)   Hypertension   Headache   CKD (chronic kidney disease), stage II   Body mass index is 56.21 kg/m.    Atrial flutter with rapid ventricular response: Continue IV Cardizem drip and taper off as able.  Consulted cardiologist to assist with management. CHa2Ds2VASC score of 1 pending echo report.  Discussed long-term antiplatelet therapy versus anticoagulation.  Patient would like to think about it and preferably talk to the cardiologist about it.  Continue IV heparin for now.  Cardiomegaly, interstitial edema and pulmonary vascular congestion on chest x-ray: Probably underlying CHF.  2D echo is pending.  Follow-up with cardiologist.  Hypertension: Continue antihypertensives  Morbid obesity (BMI 56.2): Weight loss advised  History of cluster headache: Asymptomatic.  Probable CKD stage II: Monitor creatinine   Family Communication/Anticipated D/C date and plan/Code Status   DVT prophylaxis: On heparin Code Status: Full code Family Communication: None Disposition Plan: Possible discharge to home in 2 to 3 days      Subjective:   C/o pressure in the chest and mild shortness of breath. No dizziness , palpitations.  Objective:    Vitals:   05/07/19 1200 05/07/19 1300 05/07/19 1400 05/07/19 1500  BP: (!) 122/110 (!) 122/91 115/77 114/90  Pulse:  84 (!) 53 69  Resp: (!) 37 17 (!) 28 (!) 24  Temp:   (!) 97.5 F (36.4 C)   TempSrc:   Oral   SpO2: 96% 97% 97% 95%  Weight:      Height:        Intake/Output Summary (Last 24 hours) at 05/07/2019 1535 Last data filed at 05/07/2019 1500 Gross per 24 hour  Intake 1621.82 ml  Output 2560 ml  Net -938.18 ml   Filed Weights   05/06/19 1427 05/07/19 0500  Weight: (!) 221.9 kg (!) 182.8 kg    Exam:   GEN: NAD SKIN: No rash EYES: EOMI ENT: MMM CV: irregular rate and rhythm PULM: CTA B ABD: soft, obese, NT, +BS CNS: AAO x 3, non focal EXT: Trace bilateral leg edema, no tenderness  Data Reviewed:   I have personally reviewed following labs and imaging studies:  Labs: Labs show the following:   Basic Metabolic Panel: Recent Labs  Lab 05/06/19 1500 05/06/19 1851  NA 138  --   K 3.7  --   CL 103  --   CO2 24  --   GLUCOSE 111*  --   BUN 18  --   CREATININE 1.32*  --   CALCIUM 9.0  --   MG  --  2.0   GFR Estimated Creatinine Clearance: 118.3 mL/min (A) (by C-G formula based on SCr of 1.32 mg/dL (H)). Liver Function Tests: No results for input(s): AST, ALT, ALKPHOS, BILITOT, PROT, ALBUMIN in the last 168 hours. No results for input(s): LIPASE, AMYLASE in the last 168 hours. No results for input(s): AMMONIA in the last 168 hours. Coagulation profile Recent Labs  Lab 05/06/19 1935  INR 1.3*    CBC: Recent Labs  Lab 05/06/19 1500 05/07/19 0657  WBC 4.7 5.2  NEUTROABS 2.9  --   HGB 14.7 15.5  HCT 46.0 47.3  MCV 91.5 89.8  PLT 211 205   Cardiac Enzymes: No results for input(s): CKTOTAL, CKMB, CKMBINDEX,  TROPONINI in the last 168 hours. BNP (last 3 results) No results for input(s): PROBNP in the last 8760 hours. CBG: Recent Labs  Lab 05/06/19 1823  GLUCAP 96   D-Dimer: No results for input(s): DDIMER in the last 72 hours. Hgb A1c: No results for input(s): HGBA1C in the last 72 hours. Lipid Profile: No results for input(s): CHOL, HDL, LDLCALC, TRIG, CHOLHDL, LDLDIRECT in the last 72 hours. Thyroid function studies: Recent Labs    05/06/19 1851  TSH 2.001   Anemia work up: No results for input(s): VITAMINB12, FOLATE, FERRITIN, TIBC, IRON, RETICCTPCT in the last 72 hours. Sepsis Labs: Recent Labs  Lab 05/06/19 1500 05/07/19 0657  WBC 4.7 5.2    Microbiology Recent Results (from the past 240 hour(s))  MRSA PCR Screening     Status: None    Collection Time: 05/06/19  2:19 PM   Specimen: Nasopharyngeal  Result Value Ref Range Status   MRSA by PCR NEGATIVE NEGATIVE Final    Comment:        The GeneXpert MRSA Assay (FDA approved for NASAL specimens only), is one component of a comprehensive MRSA colonization surveillance program. It is not intended to diagnose MRSA infection nor to guide or monitor treatment for MRSA infections. Performed at [facility], [address] [place], [place] [address]   SARS Coronavirus 2 by RT PCR (hospital order, performed in [facility] hospital lab) Nasopharyngeal Nasopharyngeal Swab     Status: None   Collection Time: 05/06/19  4:24 PM   Specimen: Nasopharyngeal Swab  Result Value Ref Range Status   SARS Coronavirus 2 NEGATIVE NEGATIVE Final    Comment: (NOTE) If result is NEGATIVE SARS-CoV-2 target nucleic acids are NOT DETECTED. The SARS-CoV-2 RNA is generally detectable in upper and lower  respiratory specimens during the acute phase of infection. The lowest  concentration of SARS-CoV-2 viral copies this assay can detect is 250  copies / mL. A negative result does not preclude SARS-CoV-2 infection  and should not be used as the sole basis for treatment or other  patient management decisions.  A negative result may occur with  improper specimen collection / handling, submission of specimen other  than nasopharyngeal swab, presence of viral mutation(s) within the  areas targeted by this assay, and inadequate number of viral copies  (<250 copies / mL). A negative result must be combined with clinical  observations, patient history, and epidemiological information. If result is POSITIVE SARS-CoV-2 target nucleic acids are DETECTED. The SARS-CoV-2 RNA is generally detectable in upper and lower  respiratory specimens dur ing the acute phase of infection.  Positive  results are indicative of active infection with SARS-CoV-2.  Clinical  correlation with patient history and  other diagnostic information is  necessary to determine patient infection status.  Positive results do  not rule out bacterial infection or co-infection with other viruses. If result is PRESUMPTIVE POSTIVE SARS-CoV-2 nucleic acids MAY BE PRESENT.   A presumptive positive result was obtained on the submitted specimen  and confirmed on repeat testing.  While 2019 novel coronavirus  (SARS-CoV-2) nucleic acids may be present in the submitted sample  additional confirmatory testing may be necessary for epidemiological  and / or clinical management purposes  to differentiate between  SARS-CoV-2 and other Sarbecovirus currently known to infect humans.  If clinically indicated additional testing with an alternate test  methodology [phone]) is advised. The SARS-CoV-2 RNA is generally  detectable in upper and lower respiratory sp ecimens during the acute  phase of infection. The expected result is Negative. Fact Sheet for Patients:  BoilerBrush.com.cyhttps://www.fda.gov/media/136312/download Fact Sheet for Healthcare Providers: https://pope.com/https://www.fda.gov/media/136313/download This test is not yet approved or cleared by the [place] FDA and has been authorized for detection and/or diagnosis of SARS-CoV-2 by FDA under an Emergency Use Authorization (EUA).  This EUA will remain in effect (meaning this test can be used) for the duration of the COVID-19 declaration under Section 564(b)(1) of the Act, 21 U.S.C. section 360bbb-3(b)(1), unless the authorization is terminated or revoked sooner. Performed at [facility], [address] [place], [place] [address]     Procedures and diagnostic studies:  Dg Chest Portable 1 View  Result Date: 05/06/2019 CLINICAL DATA:  Tachycardia. Shortness of breath. Palpitations. Abnormal EKG. EXAM: PORTABLE CHEST 1 VIEW COMPARISON:  None. FINDINGS: Mild to moderate cardiomegaly seen. Pulmonary vascular congestion and probable mild interstitial edema noted. No evidence of  pulmonary consolidation or pleural effusion. IMPRESSION: Cardiomegaly and pulmonary vascular congestion, with probable mild interstitial edema. Electronically Signed   By: Danae OrleansJohn A Stahl M.D.   On: 05/06/2019 15:30    Medications:   . amLODipine  5 mg Oral Daily  . Chlorhexidine Gluconate Cloth  6 each Topical Daily  . hydrochlorothiazide  25 mg Oral Daily  . losartan  50 mg Oral Daily  . sodium chloride flush  3 mL Intravenous Q12H   Continuous Infusions: . diltiazem (CARDIZEM) infusion 7.5 mg/hr (05/07/19 1525)  . heparin 2,000 Units/hr (05/07/19 1500)     LOS: 1 day   Annett Boxwell  [facility] Pager [phone].   *Please refer to [email or web address], password [email or web address] to get updated schedule on who will round on this patient, as hospitalists switch teams weekly. If 7PM-7AM, please contact night-coverage at [email or web address], password [email or web address] for any overnight needs.  05/07/2019, 3:35 PM

## 2019-05-07 NOTE — Progress Notes (Signed)
*  PRELIMINARY RESULTS* Echocardiogram 2D Echocardiogram has been performed.  Sherrie Sport 05/07/2019, 11:41 AM

## 2019-05-08 LAB — CBC
HCT: 48 % (ref 39.0–52.0)
Hemoglobin: 15.6 g/dL (ref 13.0–17.0)
MCH: 29.5 pg (ref 26.0–34.0)
MCHC: 32.5 g/dL (ref 30.0–36.0)
MCV: 90.9 fL (ref 80.0–100.0)
Platelets: 206 10*3/uL (ref 150–400)
RBC: 5.28 MIL/uL (ref 4.22–5.81)
RDW: 12.2 % (ref 11.5–15.5)
WBC: 5.2 10*3/uL (ref 4.0–10.5)
nRBC: 0 % (ref 0.0–0.2)

## 2019-05-08 LAB — BASIC METABOLIC PANEL
Anion gap: 13 (ref 5–15)
BUN: 17 mg/dL (ref 6–20)
CO2: 26 mmol/L (ref 22–32)
Calcium: 9.4 mg/dL (ref 8.9–10.3)
Chloride: 101 mmol/L (ref 98–111)
Creatinine, Ser: 1.4 mg/dL — ABNORMAL HIGH (ref 0.61–1.24)
GFR calc Af Amer: >60 mL/min (ref >60)
GFR calc non Af Amer: >60 mL/min (ref >60)
Glucose, Bld: 94 mg/dL (ref 70–99)
Potassium: 3.5 mmol/L (ref 3.5–5.1)
Sodium: 140 mmol/L (ref 135–145)

## 2019-05-08 LAB — HEPARIN LEVEL (UNFRACTIONATED): Heparin Unfractionated: 0.68 IU/mL (ref 0.30–0.70)

## 2019-05-08 LAB — ECHOCARDIOGRAM COMPLETE
Height: 71 in
Weight: 6448.01 oz

## 2019-05-08 LAB — MAGNESIUM: Magnesium: 1.9 mg/dL (ref 1.7–2.4)

## 2019-05-08 MED ORDER — APIXABAN 5 MG PO TABS
5.0000 mg | ORAL_TABLET | Freq: Two times a day (BID) | ORAL | Status: DC
  Administered 2019-05-08 – 2019-05-10 (×4): 5 mg via ORAL
  Filled 2019-05-08 (×4): qty 1

## 2019-05-08 MED ORDER — METOPROLOL TARTRATE 25 MG PO TABS
25.0000 mg | ORAL_TABLET | Freq: Two times a day (BID) | ORAL | Status: DC
  Administered 2019-05-08 (×2): 25 mg via ORAL
  Filled 2019-05-08 (×2): qty 1

## 2019-05-08 MED ORDER — DILTIAZEM HCL ER COATED BEADS 240 MG PO CP24
240.0000 mg | ORAL_CAPSULE | Freq: Every day | ORAL | Status: DC
  Administered 2019-05-08 – 2019-05-09 (×2): 240 mg via ORAL
  Filled 2019-05-08: qty 1
  Filled 2019-05-08: qty 2

## 2019-05-08 NOTE — Consult Note (Addendum)
[facility]  CARDIOLOGY CONSULT NOTE  Patient ID: Joshua ConteShawn Sampson MRN: [id number] DOB/AGE: Oct 31, 1975 43 y.o.  Admit date: 05/06/2019 Referring Physician - Dr. Myriam ForehandAyiku Primary Physician - Unknown Primary Cardiologist - Dr. Laurice RecordWeickert [facility] Reason for Consultation - Atrial flutter and rapid rate  HPI:  Joshua Sampson is a 43 y.o. male with history of HTN but no known cardiac history, who presented to the ER yesterday after transfer from urgent care for atrial flutter with rapid rate to 140s. Patient initially presented to urgent care for cluster headaches. During his urgent care evaluation he also reported having palpitations described as a racing heart rate. His sensation of a heart racing had been ongoing for two days. He has no history of same. He does not remember doing anything that triggered the event. He has also felt much more tired and winded during the last two days since the palpitations begun. He denies having any associated chest pain. Patient is physically active through work and he has not experienced any exertional chest pain at work.   He does have a history of dyspnea with exertion beginning one year ago, for which [facility] - however, no testing was performed due to patient's weight above above the maximum limit for their machines.   In the ER his HS troponin was mildly elevated to 63, then 62 on repeat. BNP was 102. CXR showed some pulmonary vascular congestion suggestive of mild edema. EKG with some ST changes but not suggestive of ischemia. He was placed on a diltiazem drip and heart rate improved from 140s to 110s.   Review of systems complete and found to be negative unless listed above   Past Medical History:  Diagnosis Date  . Hypertension   . Obesity     Past Surgical History:  Procedure Laterality Date  . CHOLECYSTECTOMY      Medications Prior to Admission  Medication Sig Dispense Refill Last Dose  . amLODipine (NORVASC) 5 MG  tablet Take 5 mg by mouth daily.     . hydrochlorothiazide (HYDRODIURIL) 25 MG tablet TAKE 1 TABLET BY MOUTH DAILY     . losartan (COZAAR) 50 MG tablet Take by mouth.      Social History   Socioeconomic History  . Marital status: Married    Spouse name: Not on file  . Number of children: Not on file  . Years of education: Not on file  . Highest education level: Not on file  Occupational History  . Not on file  Social Needs  . Financial resource strain: Not on file  . Food insecurity    Worry: Not on file    Inability: Not on file  . Transportation needs    Medical: Not on file    Non-medical: Not on file  Tobacco Use  . Smoking status: Never Smoker  . Smokeless tobacco: Never Used  Substance and Sexual Activity  . Alcohol use: Not Currently  . Drug use: Never  . Sexual activity: Not on file  Lifestyle  . Physical activity    Days per week: Not on file    Minutes per session: Not on file  . Stress: Not on file  Relationships  . Social Musicianconnections    Talks on phone: Not on file    Gets together: Not on file    Attends religious service: Not on file    Active member of club or organization: Not on file    Attends meetings of clubs or  organizations: Not on file    Relationship status: Not on file  . Intimate partner violence    Fear of current or ex partner: Not on file    Emotionally abused: Not on file    Physically abused: Not on file    Forced sexual activity: Not on file  Other Topics Concern  . Not on file  Social History Narrative  . Not on file    Family History  Problem Relation Age of Onset  . Hypertension Mother   . Hypertension Father   . Stroke Brother     Review of systems complete and found to be negative unless listed above   PHYSICAL EXAM  General: Obese. Well developed, well nourished, in no acute distress HEENT:  Normocephalic and atramatic Neck:  No JVD.  Lungs: Clear bilaterally to auscultation and percussion. Heart: Distant heart  sounds secondary to habitus. Normal S1 and S2 without gallops or murmurs.  Extremities: No clubbing, cyanosis or edema.   Neuro: Alert and oriented X 3. Psych:  Good affect, responds appropriately  Labs:   Lab Results  Component Value Date   WBC 5.2 05/08/2019   HGB 15.6 05/08/2019   HCT 48.0 05/08/2019   MCV 90.9 05/08/2019   PLT 206 05/08/2019    Recent Labs  Lab 05/08/19 0355  NA 140  K 3.5  CL 101  CO2 26  BUN 17  CREATININE 1.40*  CALCIUM 9.4  GLUCOSE 94   No results found for: CKTOTAL, CKMB, CKMBINDEX, TROPONINI No results found for: CHOL No results found for: HDL No results found for: LDLCALC No results found for: TRIG No results found for: CHOLHDL No results found for: LDLDIRECT    Radiology: Dg Chest Portable 1 View  Result Date: 05/06/2019 CLINICAL DATA:  Tachycardia. Shortness of breath. Palpitations. Abnormal EKG. EXAM: PORTABLE CHEST 1 VIEW COMPARISON:  None. FINDINGS: Mild to moderate cardiomegaly seen. Pulmonary vascular congestion and probable mild interstitial edema noted. No evidence of pulmonary consolidation or pleural effusion. IMPRESSION: Cardiomegaly and pulmonary vascular congestion, with probable mild interstitial edema. Electronically Signed   By: Marlaine Hind M.D.   On: 05/06/2019 15:30    EKG: Atrial flutter, rate of 148 BPM, does not meet STEMI criteria   ASSESSMENT AND PLAN:  Mr. Heckard is a 43 year old male with no significant cardiac history who presented to the ER after having an abnormal EKG in urgent care. Patient found to have atrial flutter with 2:1 block and heart rate of 150 BPM. He was started on diltiazem drip and heart rate has somewhat improved. Patient feels improved today and no longer having shortness of breath.   #New Onset Atrial flutter/fibrillation:  Patient presented with atrial flutter with rapid rate on EKG. Since that time he has been intermittently in atrial fibrillation and flutter on telemetry. Heart rate improved  on diltiazem drip, and after addition of oral diltiazem capsule. If heart rate remains at goal of 60-90 BPM, he will be appropriate for discharged tomorrow. His CHADS-VASc score is 1 (HTN). Will begin short term anticoagulation with Eliquis in preparation for likely cardioversion in the outpatient setting. Suspect sleep apnea and obesity as likely underlying reasons for his atrial fibrillation. Will work up further with sleep study in outpatient setting. ECHO obtained but results pending. No further cardiac diagnostics indicated at this time.  - Begin eliquis 5 mg BID for anticoagulation  - Begin diltiazem 240 mg daily - D/C diltiazem drip  - Patient appropriate for transfer from  ICU to monitored telemetry bed  - Likely appropriate for discharge tomorrow if heart rate remains well controlled on current oral medications   #Troponin elevation:  Patient with mildly elevated HS-troponin to 63 initially, then 62 on repeat. Patient has no significant chest pain. Given no significant delta on repeat troponin, lower suspicion for ACS. Mild troponin elevation likely due to CKD versus demand ischemia from rapid heart rate.  - No indication for further cardiac interventions or diagnostics at this time   The patient's history and exam findings were discussed with Dr. Gwen Pounds. The plan was made in conjunction with Dr. Gwen Pounds.  Signed: Harrell Gave PA-C 05/08/2019, 7:50 AM  The patient has been interviewed and examined. I agree with assessment and plan above. Arnoldo Hooker MD [facility]

## 2019-05-08 NOTE — Progress Notes (Signed)
Ch visited with pt during rounds. Pt was alert and siting upright comfortably in recliner. Pt shared that he has just started a new diet (Keto) recently and that he has been dealing with hypertension for a while. Pt was concerned that his recent diet changes may have caused him to have heart issues. Pt was being treated for Afib and hypertension. Pt expressed that he has recently been diagnosed with sleep apnea. Pt is motivated to continue to be on the keto diet and monitor hs BP. Ch provided social support and asked guided questions regarding pt's progress.  F/u with pt before he is d/c on 05/09/19   05/08/19 1000  Clinical Encounter Type  Visited With Patient;Health care provider  Visit Type Social support  Stress Factors  Patient Stress Factors Health changes  Family Stress Factors None identified

## 2019-05-08 NOTE — Progress Notes (Signed)
Progress Note    Joshua Sampson  XBJ:[id number] DOB: 1976/05/15  DOA: 05/06/2019 PCP: System, Pcp Not In     Assessment/Plan:   Principal Problem:   Atrial flutter (HCC) Active Problems:   Morbid obesity (HCC)   Hypertension   Headache   CKD (chronic kidney disease), stage II   Body mass index is 55.93 kg/m.    Atrial flutter/atrial fibrillation with RVR: Heart rate is improved.  He is off of IV Cardizem infusion.  He has been started on oral Cardizem and Eliquis by cardiologist.  2D echo showed EF estimated at 45 to 50%, borderline LVH.  Cardiomegaly and pulmonary vascular congestion on chest x-ray: 2D echo showed EF of 45 to 50% and borderline LVH.  Hypertension: Continue antihypertensives  Morbid obesity: Patient has been counseled regarding weight loss.  He was also advised to follow-up with a general surgeon to discuss bariatric surgery.  Outpatient follow-up for sleep study was strongly recommended to check for obstructive sleep apnea.  History of cluster headache: Stable  Probable CKD  Cardiomegaly, interstitial edema and pulmonary vascular congestion on chest x-ray: Probably underlying CHF.  2D echo is pending.  Follow-up with cardiologist.  Hypertension: Continue antihypertensives  Morbid obesity (BMI 56.2): Weight loss advised  History of cluster headache: Asymptomatic.  Probable CKD stage II: Monitor creatinine  Elevated troponins: Troponins were flat and was probably due to arrhythmia   Family Communication/Anticipated D/C date and plan/Code Status   DVT prophylaxis: On heparin Code Status: Full code Family Communication: None Disposition Plan: Possible discharge to home tomorrow      Subjective:   No shortness of breath, chest pain, dizziness, palpitation  Objective:    Vitals:   05/08/19 1100 05/08/19 1200 05/08/19 1300 05/08/19 1332  BP: (!) 127/109 (!) 142/103  113/83  Pulse: 97 77 81 (!) 131  Resp:      Temp:      TempSrc:       SpO2: 98% 94% 92%   Weight:      Height:        Intake/Output Summary (Last 24 hours) at 05/08/2019 1544 Last data filed at 05/08/2019 1351 Gross per 24 hour  Intake 654.91 ml  Output 1900 ml  Net -1245.09 ml   Filed Weights   05/06/19 1427 05/07/19 0500 05/08/19 0500  Weight: (!) 221.9 kg (!) 182.8 kg (!) 181.9 kg    Exam:  GEN: NAD SKIN: Gynecomastia, no rash.  EYES: No pallor or icterus ENT: MMM CV: irregular rate and rhythm PULM: CTA B ABD: soft, obese, NT, +BS CNS: AAO x 3, non focal EXT: trace edema on b/l legs, no tenderness   Data Reviewed:   I have personally reviewed following labs and imaging studies:  Labs: Labs show the following:   Basic Metabolic Panel: Recent Labs  Lab 05/06/19 1500 05/06/19 1851 05/08/19 0355  NA 138  --  140  K 3.7  --  3.5  CL 103  --  101  CO2 24  --  26  GLUCOSE 111*  --  94  BUN 18  --  17  CREATININE 1.32*  --  1.40*  CALCIUM 9.0  --  9.4  MG  --  2.0 1.9   GFR Estimated Creatinine Clearance: 111.1 mL/min (A) (by C-G formula based on SCr of 1.4 mg/dL (H)). Liver Function Tests: No results for input(s): AST, ALT, ALKPHOS, BILITOT, PROT, ALBUMIN in the last 168 hours. No results for input(s): LIPASE, AMYLASE in  the last 168 hours. No results for input(s): AMMONIA in the last 168 hours. Coagulation profile Recent Labs  Lab 05/06/19 1935  INR 1.3*    CBC: Recent Labs  Lab 05/06/19 1500 05/07/19 0657 05/08/19 0355  WBC 4.7 5.2 5.2  NEUTROABS 2.9  --   --   HGB 14.7 15.5 15.6  HCT 46.0 47.3 48.0  MCV 91.5 89.8 90.9  PLT 211 205 206   Cardiac Enzymes: No results for input(s): CKTOTAL, CKMB, CKMBINDEX, TROPONINI in the last 168 hours. BNP (last 3 results) No results for input(s): PROBNP in the last 8760 hours. CBG: Recent Labs  Lab 05/06/19 1823  GLUCAP 96   D-Dimer: No results for input(s): DDIMER in the last 72 hours. Hgb A1c: No results for input(s): HGBA1C in the last 72 hours. Lipid  Profile: No results for input(s): CHOL, HDL, LDLCALC, TRIG, CHOLHDL, LDLDIRECT in the last 72 hours. Thyroid function studies: Recent Labs    05/06/19 1851  TSH 2.001   Anemia work up: No results for input(s): VITAMINB12, FOLATE, FERRITIN, TIBC, IRON, RETICCTPCT in the last 72 hours. Sepsis Labs: Recent Labs  Lab 05/06/19 1500 05/07/19 0657 05/08/19 0355  WBC 4.7 5.2 5.2    Microbiology Recent Results (from the past 240 hour(s))  MRSA PCR Screening     Status: None   Collection Time: 05/06/19  2:19 PM   Specimen: Nasopharyngeal  Result Value Ref Range Status   MRSA by PCR NEGATIVE NEGATIVE Final    Comment:        The GeneXpert MRSA Assay (FDA approved for NASAL specimens only), is one component of a comprehensive MRSA colonization surveillance program. It is not intended to diagnose MRSA infection nor to guide or monitor treatment for MRSA infections. Performed at [facility], [address] [place], [place] [address]   SARS Coronavirus 2 by RT PCR (hospital order, performed in [facility] hospital lab) Nasopharyngeal Nasopharyngeal Swab     Status: None   Collection Time: 05/06/19  4:24 PM   Specimen: Nasopharyngeal Swab  Result Value Ref Range Status   SARS Coronavirus 2 NEGATIVE NEGATIVE Final    Comment: (NOTE) If result is NEGATIVE SARS-CoV-2 target nucleic acids are NOT DETECTED. The SARS-CoV-2 RNA is generally detectable in upper and lower  respiratory specimens during the acute phase of infection. The lowest  concentration of SARS-CoV-2 viral copies this assay can detect is 250  copies / mL. A negative result does not preclude SARS-CoV-2 infection  and should not be used as the sole basis for treatment or other  patient management decisions.  A negative result may occur with  improper specimen collection / handling, submission of specimen other  than nasopharyngeal swab, presence of viral mutation(s) within the  areas targeted by this assay,  and inadequate number of viral copies  (<250 copies / mL). A negative result must be combined with clinical  observations, patient history, and epidemiological information. If result is POSITIVE SARS-CoV-2 target nucleic acids are DETECTED. The SARS-CoV-2 RNA is generally detectable in upper and lower  respiratory specimens dur ing the acute phase of infection.  Positive  results are indicative of active infection with SARS-CoV-2.  Clinical  correlation with patient history and other diagnostic information is  necessary to determine patient infection status.  Positive results do  not rule out bacterial infection or co-infection with other viruses. If result is PRESUMPTIVE POSTIVE SARS-CoV-2 nucleic acids MAY BE PRESENT.   A presumptive positive result was obtained on  the submitted specimen  and confirmed on repeat testing.  While 2019 novel coronavirus  (SARS-CoV-2) nucleic acids may be present in the submitted sample  additional confirmatory testing may be necessary for epidemiological  and / or clinical management purposes  to differentiate between  SARS-CoV-2 and other Sarbecovirus currently known to infect humans.  If clinically indicated additional testing with an alternate test  methodology [phone]) is advised. The SARS-CoV-2 RNA is generally  detectable in upper and lower respiratory sp ecimens during the acute  phase of infection. The expected result is Negative. Fact Sheet for Patients:  BoilerBrush.com.cy Fact Sheet for Healthcare Providers: https://pope.com/ This test is not yet approved or cleared by the [place] FDA and has been authorized for detection and/or diagnosis of SARS-CoV-2 by FDA under an Emergency Use Authorization (EUA).  This EUA will remain in effect (meaning this test can be used) for the duration of the COVID-19 declaration under Section 564(b)(1) of the Act, 21 U.S.C. section 360bbb-3(b)(1), unless  the authorization is terminated or revoked sooner. Performed at [facility], [address] [place], [place] [address]     Procedures and diagnostic studies:  No results found.  Medications:   . amLODipine  5 mg Oral Daily  . apixaban  5 mg Oral BID  . Chlorhexidine Gluconate Cloth  6 each Topical Daily  . diltiazem  240 mg Oral Daily  . hydrochlorothiazide  25 mg Oral Daily  . losartan  50 mg Oral Daily  . metoprolol tartrate  25 mg Oral BID  . sodium chloride flush  3 mL Intravenous Q12H   Continuous Infusions:    LOS: 2 days   Tameeka Luo  [facility] Pager [phone].   *Please refer to [email or web address], password [email or web address] to get updated schedule on who will round on this patient, as hospitalists switch teams weekly. If 7PM-7AM, please contact night-coverage at [email or web address], password [email or web address] for any overnight needs.  05/08/2019, 3:44 PM

## 2019-05-08 NOTE — Consult Note (Signed)
ANTICOAGULATION CONSULT NOTE   Pharmacy Consult for Heparin Infusion Indication: atrial fibrillation  No Known Allergies  Patient Measurements: Height: 5\' 11"  (180.3 cm) Weight: (!) 401 lb 0.3 oz (181.9 kg) IBW/kg (Calculated) : 75.3 Heparin Dosing Weight: 132.5 kg  Vital Signs: Temp: 97.8 F (36.6 C) (11/03 0200) Temp Source: Oral (11/03 0200) BP: 136/110 (11/03 0000) Pulse Rate: 76 (11/03 0300)  Labs: Recent Labs    05/06/19 1500 05/06/19 1851 05/06/19 1935 05/07/19 0030 05/07/19 0657 05/08/19 0355  HGB 14.7  --   --   --  15.5 15.6  HCT 46.0  --   --   --  47.3 48.0  PLT 211  --   --   --  205 206  APTT  --   --  132*  --   --   --   LABPROT  --   --  15.9*  --   --   --   INR  --   --  1.3*  --   --   --   HEPARINUNFRC  --   --   --  0.59 0.53 0.68  CREATININE 1.32*  --   --   --   --  1.40*  TROPONINIHS 63* 62*  --   --   --   --     Estimated Creatinine Clearance: 111.1 mL/min (A) (by C-G formula based on SCr of 1.4 mg/dL (H)).   Medical History: Past Medical History:  Diagnosis Date  . Hypertension   . Obesity     Medications:  Medications Prior to Admission  Medication Sig Dispense Refill Last Dose  . amLODipine (NORVASC) 5 MG tablet Take 5 mg by mouth daily.     . hydrochlorothiazide (HYDRODIURIL) 25 MG tablet TAKE 1 TABLET BY MOUTH DAILY     . losartan (COZAAR) 50 MG tablet Take by mouth.      Scheduled:  . amLODipine  5 mg Oral Daily  . Chlorhexidine Gluconate Cloth  6 each Topical Daily  . hydrochlorothiazide  25 mg Oral Daily  . losartan  50 mg Oral Daily  . sodium chloride flush  3 mL Intravenous Q12H   Infusions:  . diltiazem (CARDIZEM) infusion 5 mg/hr (05/08/19 0422)  . heparin 2,000 Units/hr (05/08/19 0000)   PRN: acetaminophen **OR** acetaminophen, metoprolol tartrate, ondansetron **OR** ondansetron (ZOFRAN) IV, polyethylene glycol Anti-infectives (From admission, onward)   None      Assessment: Pharmacy has been consulted  to initiate Heparin Infusion in 43yo patient with new diagnosis of Atrial fibrillation. Patient has no prior history of anticoagulant use. Baseline labs were ordered but unable to be obtained prior to initiation of heparin infusion.   Goal of Therapy:  Heparin level 0.3-0.7 units/ml Monitor platelets by anticoagulation protocol: Yes   Plan:  11/03 @ 0500 HL 0.68 therapeutic. Will continue current rate at 2000 units/hr and will recheck w/ am labs. CBC stable will continue to monitor.  Tobie Lords, PharmD, BCPS Clinical Pharmacist 05/08/2019,6:00 AM

## 2019-05-08 NOTE — Plan of Care (Signed)
  Problem: Education: Goal: Knowledge of General Education information will improve Description: Including pain rating scale, medication(s)/side effects and non-pharmacologic comfort measures Outcome: Progressing   Problem: Clinical Measurements: Goal: Ability to maintain clinical measurements within normal limits will improve Outcome: Progressing   Problem: Education: Goal: Knowledge of disease or condition will improve Outcome: Progressing   Problem: Activity: Goal: Ability to tolerate increased activity will improve Outcome: Progressing

## 2019-05-09 ENCOUNTER — Inpatient Hospital Stay: Payer: BC Managed Care – PPO

## 2019-05-09 DIAGNOSIS — R778 Other specified abnormalities of plasma proteins: Secondary | ICD-10-CM | POA: Diagnosis present

## 2019-05-09 DIAGNOSIS — R079 Chest pain, unspecified: Secondary | ICD-10-CM

## 2019-05-09 LAB — PHOSPHORUS: Phosphorus: 4.8 mg/dL — ABNORMAL HIGH (ref 2.5–4.6)

## 2019-05-09 LAB — BLOOD GAS, ARTERIAL
Acid-base deficit: 3 mmol/L — ABNORMAL HIGH (ref 0.0–2.0)
Bicarbonate: 21.1 mmol/L (ref 20.0–28.0)
FIO2: 0.28
O2 Saturation: 95.1 %
Patient temperature: 37
pCO2 arterial: 34 mmHg (ref 32.0–48.0)
pH, Arterial: 7.4 (ref 7.350–7.450)
pO2, Arterial: 76 mmHg — ABNORMAL LOW (ref 83.0–108.0)

## 2019-05-09 LAB — GLUCOSE, CAPILLARY
Glucose-Capillary: 153 mg/dL — ABNORMAL HIGH (ref 70–99)
Glucose-Capillary: 134 mg/dL — ABNORMAL HIGH (ref 70–99)

## 2019-05-09 LAB — BASIC METABOLIC PANEL
Anion gap: 15 (ref 5–15)
BUN: 25 mg/dL — ABNORMAL HIGH (ref 6–20)
CO2: 20 mmol/L — ABNORMAL LOW (ref 22–32)
Calcium: 8.7 mg/dL — ABNORMAL LOW (ref 8.9–10.3)
Chloride: 99 mmol/L (ref 98–111)
Creatinine, Ser: 2.41 mg/dL — ABNORMAL HIGH (ref 0.61–1.24)
GFR calc Af Amer: 36 mL/min — ABNORMAL LOW (ref >60)
GFR calc non Af Amer: 31 mL/min — ABNORMAL LOW (ref >60)
Glucose, Bld: 165 mg/dL — ABNORMAL HIGH (ref 70–99)
Potassium: 3.6 mmol/L (ref 3.5–5.1)
Sodium: 134 mmol/L — ABNORMAL LOW (ref 135–145)

## 2019-05-09 LAB — LACTIC ACID, PLASMA: Lactic Acid, Venous: 3 mmol/L (ref 0.5–1.9)

## 2019-05-09 LAB — BRAIN NATRIURETIC PEPTIDE: B Natriuretic Peptide: 275 pg/mL — ABNORMAL HIGH (ref 0.0–100.0)

## 2019-05-09 LAB — PROCALCITONIN: Procalcitonin: <0.1 ng/mL

## 2019-05-09 LAB — TROPONIN I (HIGH SENSITIVITY)
Troponin I (High Sensitivity): 28 ng/L — ABNORMAL HIGH (ref <18)
Troponin I (High Sensitivity): 31 ng/L — ABNORMAL HIGH (ref <18)
Troponin I (High Sensitivity): 31 ng/L — ABNORMAL HIGH (ref <18)

## 2019-05-09 LAB — MAGNESIUM: Magnesium: 2.2 mg/dL (ref 1.7–2.4)

## 2019-05-09 MED ORDER — MORPHINE SULFATE (PF) 2 MG/ML IV SOLN
2.0000 mg | INTRAVENOUS | Status: DC | PRN
  Administered 2019-05-09: 2 mg via INTRAVENOUS
  Filled 2019-05-09: qty 1

## 2019-05-09 MED ORDER — NOREPINEPHRINE 4 MG/250ML-% IV SOLN
0.0000 ug/min | INTRAVENOUS | Status: DC
  Administered 2019-05-09: 21:00:00 5 ug/min via INTRAVENOUS
  Filled 2019-05-09: qty 250

## 2019-05-09 MED ORDER — METOPROLOL TARTRATE 50 MG PO TABS
50.0000 mg | ORAL_TABLET | Freq: Two times a day (BID) | ORAL | Status: DC
  Administered 2019-05-09: 50 mg via ORAL
  Filled 2019-05-09: qty 1

## 2019-05-09 MED ORDER — SODIUM CHLORIDE 0.9 % IV SOLN
0.0000 ug/min | INTRAVENOUS | Status: DC
  Administered 2019-05-10: 50 ug/min via INTRAVENOUS
  Administered 2019-05-10: 30 ug/min via INTRAVENOUS
  Filled 2019-05-09 (×2): qty 1
  Filled 2019-05-09: qty 10

## 2019-05-09 MED ORDER — LEVALBUTEROL HCL 0.63 MG/3ML IN NEBU: 0.6300 mg | INHALATION_SOLUTION | Freq: Four times a day (QID) | RESPIRATORY_TRACT | Status: DC | PRN

## 2019-05-09 MED ORDER — FUROSEMIDE 10 MG/ML IJ SOLN: 40.0000 mg | Freq: Two times a day (BID) | INTRAMUSCULAR | Status: DC

## 2019-05-09 MED ORDER — NITROGLYCERIN 0.4 MG SL SUBL
SUBLINGUAL_TABLET | SUBLINGUAL | Status: AC
  Administered 2019-05-09: 17:00:00
  Filled 2019-05-09: qty 1

## 2019-05-09 MED ORDER — PANTOPRAZOLE SODIUM 40 MG IV SOLR
40.0000 mg | INTRAVENOUS | Status: DC
  Administered 2019-05-09: 40 mg via INTRAVENOUS
  Filled 2019-05-09: qty 40

## 2019-05-09 MED ORDER — METOPROLOL TARTRATE 50 MG PO TABS: 50.0000 mg | ORAL_TABLET | Freq: Two times a day (BID) | ORAL | Status: DC

## 2019-05-09 MED ORDER — METOPROLOL TARTRATE 50 MG PO TABS: 50.0000 mg | ORAL_TABLET | Freq: Once | ORAL | Status: DC

## 2019-05-09 MED ORDER — IOHEXOL 350 MG/ML SOLN: 125.0000 mL | Freq: Once | INTRAVENOUS | Status: DC | PRN

## 2019-05-09 MED ORDER — METOPROLOL TARTRATE 50 MG PO TABS: 100.0000 mg | ORAL_TABLET | Freq: Two times a day (BID) | ORAL | Status: DC

## 2019-05-09 MED ORDER — METOPROLOL TARTRATE 50 MG PO TABS
50.0000 mg | ORAL_TABLET | Freq: Once | ORAL | Status: AC
  Administered 2019-05-09: 14:00:00 50 mg via ORAL
  Filled 2019-05-09: qty 1

## 2019-05-09 MED ORDER — ALUM & MAG HYDROXIDE-SIMETH 200-200-20 MG/5ML PO SUSP
30.0000 mL | ORAL | Status: DC | PRN
  Administered 2019-05-09: 30 mL via ORAL
  Filled 2019-05-09 (×2): qty 30

## 2019-05-09 MED ORDER — LIDOCAINE VISCOUS HCL 2 % MT SOLN
15.0000 mL | Freq: Once | OROMUCOSAL | Status: DC
  Filled 2019-05-09: qty 15

## 2019-05-09 MED ORDER — ALUM & MAG HYDROXIDE-SIMETH 200-200-20 MG/5ML PO SUSP
30.0000 mL | Freq: Once | ORAL | Status: DC
  Filled 2019-05-09: qty 30

## 2019-05-09 MED ORDER — SODIUM CHLORIDE 0.9 % IV BOLUS
500.0000 mL | Freq: Once | INTRAVENOUS | Status: AC
  Administered 2019-05-09: 500 mL via INTRAVENOUS

## 2019-05-09 MED ORDER — SENNOSIDES-DOCUSATE SODIUM 8.6-50 MG PO TABS
1.0000 | ORAL_TABLET | Freq: Two times a day (BID) | ORAL | Status: DC
  Administered 2019-05-10 – 2019-05-13 (×6): 1 via ORAL
  Filled 2019-05-09 (×7): qty 1

## 2019-05-09 NOTE — Progress Notes (Signed)
Pt began having mid-sternal chest pain, burning in nature, with diaphoresis and nausea. Dr. Nehemiah Massed and Dr. Blaine Hamper notified via secure chat. Dr. Blaine Hamper placed orders for PRN senna. NTG not given due to hypotension. 500 cc fluid bolus given.

## 2019-05-09 NOTE — Progress Notes (Signed)
Robyne Peers, PA-C notified of patient heart rate still elevated in 120/130s. Ordered to follow the orders that were changed this morning during rounds. Will update if patient does not respond.

## 2019-05-09 NOTE — Progress Notes (Signed)
Niu & Nehemiah Massed notified of patient's heart rate not responding to medication given this morning. New orders given to change metoprolol to 100 twice a day. Will continue to update & assess.

## 2019-05-09 NOTE — Progress Notes (Signed)
[facility] Cardiology  SUBJECTIVE: Patient states that he is feeling well. He felt some mild nausea after taking metoprolol yesterday but was able to eat his breakfast this morning without difficulty. Has not vomited. He denies any other change from yesterday. He has no palpitations, chest pain, lightheadedness, or dizziness.   Heart rate currently 100 - 110 BPM. Overnight low of 46 BPM overnight.   Vitals:   05/08/19 2017 05/08/19 2103 05/08/19 2103 05/09/19 0554  BP: 107/88  105/75 (!) 115/97  Pulse: (!) 105 (!) 105 (!) 46 (!) 44  Resp: (!) 24  20 20   Temp: 97.9 F (36.6 C)  97.7 F (36.5 C) (!) 97.5 F (36.4 C)  TempSrc: Oral  Oral Oral  SpO2: 93%  98% 99%  Weight:   (!) 216.5 kg (!) 216.6 kg  Height:   5\' 11"  (1.803 m)      Intake/Output Summary (Last 24 hours) at 05/09/2019 0749 Last data filed at 05/09/2019 13/10/2018 Gross per 24 hour  Intake 503.65 ml  Output 1675 ml  Net -1171.35 ml    PHYSICAL EXAM  General: Well developed, well nourished, in no acute distress HEENT:  Normocephalic and atramatic Neck:  No JVD.  Lungs: Clear bilaterally to auscultation and percussion. Heart: HRRR . Normal S1 and S2 without gallops or murmurs.  Abdomen: Bowel sounds are positive, abdomen soft and non-tender  Msk:  Back normal, normal gait. Normal strength and tone for age. Extremities: No clubbing, cyanosis or edema.   Neuro: Alert and oriented X 3. Psych:  Good affect, responds appropriately   LABS: Basic Metabolic Panel: Recent Labs    05/06/19 1500 05/06/19 1851 05/08/19 0355  NA 138  --  140  K 3.7  --  3.5  CL 103  --  101  CO2 24  --  26  GLUCOSE 111*  --  94  BUN 18  --  17  CREATININE 1.32*  --  1.40*  CALCIUM 9.0  --  9.4  MG  --  2.0 1.9   Liver Function Tests: No results for input(s): AST, ALT, ALKPHOS, BILITOT, PROT, ALBUMIN in the last 72 hours. No results for input(s): LIPASE, AMYLASE in the last 72 hours. CBC: Recent Labs    05/06/19 1500 05/07/19 0657  05/08/19 0355  WBC 4.7 5.2 5.2  NEUTROABS 2.9  --   --   HGB 14.7 15.5 15.6  HCT 46.0 47.3 48.0  MCV 91.5 89.8 90.9  PLT 211 205 206   Cardiac Enzymes: No results for input(s): CKTOTAL, CKMB, CKMBINDEX, TROPONINI in the last 72 hours. BNP: Invalid input(s): POCBNP D-Dimer: No results for input(s): DDIMER in the last 72 hours. Hemoglobin A1C: No results for input(s): HGBA1C in the last 72 hours. Fasting Lipid Panel: No results for input(s): CHOL, HDL, LDLCALC, TRIG, CHOLHDL, LDLDIRECT in the last 72 hours. Thyroid Function Tests: Recent Labs    05/06/19 1851  TSH 2.001   Anemia Panel: No results for input(s): VITAMINB12, FOLATE, FERRITIN, TIBC, IRON, RETICCTPCT in the last 72 hours.  No results found.   Echo - LV function 45-50%. Mild MR, TR.   TELEMETRY: heart rate between 90 - 110 BPM during evaluation   ASSESSMENT AND PLAN: Joshua Sampson is a 43 year old male with no significant cardiac history who was admitted for rapid atrial flutter. Heart rate improved on diltiazem drip. Now off drip and on oral cardizem and metoprolol.   Principal Problem:   Atrial flutter (HCC) Active Problems:  Morbid obesity ([place])   Hypertension   Headache   CKD (chronic kidney disease), stage II    #New Onset Atrial flutter/fibrillation:  Patient presented with atrial flutter with rapid rate on EKG. Since that time he has been intermittently in atrial fibrillation and flutter on telemetry. Heart rate goal of 60-90 BPM. His CHADS-VASc score is 1 (HTN). Will begin short term anticoagulation with Eliquis in preparation for likely cardioversion in the outpatient setting. Suspect sleep apnea and obesity as likely underlying reasons for his atrial fibrillation. Will work up further with sleep study in outpatient setting. ECHO with mildly reduced LV function and EF of 45-50% - although limited views based on patient's habitus. No further cardiac diagnostics indicated at this time.  - Continue  eliquis 5 mg BID for anticoagulation  - Continue diltiazem 240 mg daily - Increase metoprolol tartrate from 25 mg to 50 mg BID - Given improvement of heart rate on oral medications, would recommend discharge home today - Follow up with me or Dr. Nehemiah Massed at [facility] clinic in ~1 week   #Troponin elevation:  Patient with mildly elevated HS-troponin to 63 initially, then 62 on repeat. Patient has no significant chest pain. Given no significant delta on repeat troponin, lower suspicion for ACS. Mild troponin elevation likely due to CKD versus demand ischemia from rapid heart rate.  - No indication for further cardiac interventions or diagnostics at this time   [name], [name] 05/09/2019 7:49 AM

## 2019-05-09 NOTE — Consult Note (Signed)
Name: Joshua Sampson MRN: [id number] DOB: 08-Aug-1975    ADMISSION DATE:  05/06/2019 CONSULTATION DATE: 05/09/2019  REFERRING MD : Webb SilversmithElizabeth Ouma, NP  CHIEF COMPLAINT: Headaches   BRIEF PATIENT DESCRIPTION:  43 yo male admitted to the stepdown unit with new onset atrial fibrillation with rvr initially requiring cardizem gtt and acute hypoxic respiratory failure secondary to suspected undiagnosed OSA/OHS/vascular congestion/possible pulmonary embolism. Pt transitioned to po antiarrhythmic medication and cardizem gtt discontinued, pt transferred to the telemetry unit 11/3.  He required transfer back to the stepdown unit and subsequently changed to ICU status 11/4 with hypotension likely secondary to antihypertensive and antiarrhythmic medications requiring neo-synephrine gtt and worsening acute hypoxic respiratory failure secondary to vascular congestion  requiring Bipap   SIGNIFICANT EVENTS/STUDIES:  11/1: Pt admitted to the stepdown unit with new onset atrial fibrillation with rvr requiring cardizem gtt  11/2: Echo revealed EF 45 to 50%, borderline left ventricular hypertrophy, right ventricular size normal, no increase in right ventricular wall thickness, and global RV systolic function has normal systolic function. 11/3: Pt transitioned off cardizem gtt and started on oral cardizem.  Oral eliquis started for preparation of likely cardioversion in the outpatient setting per Cardiology 11/3: Pts heart rate remained stable, therefore transferred to the telemetry unit  11/4: Pt developed indigestion then 10/10 chest tightness, dyspnea, and diaphoretic (O2 applied/nitro given/EKG obtained revealing atrial fibrillation hr 89-low 100's).  Post treatment pt became hypotensive, therefore rapid response initiated and pt transferred to the stepdown unit.  Upon arrival to the stepdown unit pt remained hypotensive and diaphoretic PCCM consulted 11/4.   11/4: Pt remained hypotensive requiring levophed gtt and  repeat lab workup per PCCM team.  Pt continued to have dyspnea with exertion. CXR concerning for vascular congestion, however due to pressor requirement unable to diurese.  Pt c/o constipation and epigastric pain KUB negative  HISTORY OF PRESENT ILLNESS:   This is a 43 yo male with a PMH of Obesity, Essential HTN, Morbid Obesity, and Dyspnea with Exertion ([facility], however per consult note due to pts weight unable to undergo echocardiogram, stress test, or cardiac cath suggested possibly performing MUGA scan, however scan would only give LV and RV function but no ischemic testing). Pt initially presented to Urgent Care with c/o cluster headaches onset 10/31 and later developed dyspnea with exertion with palpitations with chest pressure on 11/1. Upon presentation EKG revealed tachycardia with ST elevation in lead I and aVL, however repeat EKG concerning for atrial flutter hr 140's  Therefore, pt received aspirin, iv placed, and pt [facility] via EMS for further workup. In the ER EKG consistent with new onset atrial flutter with possible inferior ischemic but did not meet STEMI criteria.  ER physician contacted [facility] Cardiologist Dr. Juliann Paresallwood to review EKG findings and he agreed pt did not meet STEMI criteria.  He received iv cardizem with minimal response and required cardizem gtt.  Lab results revealed creatinine 1.32, BNP 102, troponin 63, urine drug screen negative, and COVID-19 negative.  CXR concerning for cardiomegaly and pulmonary vascular congestion.  Cardiology consulted and pt admitted to the stepdown unit per hospitalist team for additional workup and treatment.  Detailed hospital course listed above under significant test/events   PAST MEDICAL HISTORY :   has a past medical history of Hypertension and Obesity.  has a past surgical history that includes Cholecystectomy. Prior to Admission medications   Medication Sig Start Date End Date Taking? Authorizing  Provider  amLODipine (NORVASC)  5 MG tablet Take 5 mg by mouth daily. 04/26/19   [provider]  hydrochlorothiazide (HYDRODIURIL) 25 MG tablet TAKE 1 TABLET BY MOUTH DAILY 08/30/18   [provider]  losartan (COZAAR) 50 MG tablet Take by mouth. 09/02/17   [provider]   No Known Allergies  FAMILY HISTORY:  family history includes Hypertension in his father and mother; Stroke in his brother. SOCIAL HISTORY:  reports that he has never smoked. He has never used smokeless tobacco. He reports previous alcohol use. He reports that he does not use drugs.  REVIEW OF SYSTEMS: Positives in BOLD   Constitutional: Negative for fever, chills, weight loss, malaise/fatigue and diaphoresis.  HENT: Negative for hearing loss, ear pain, nosebleeds, congestion, sore throat, neck pain, tinnitus and ear discharge.   Eyes: Negative for blurred vision, double vision, photophobia, pain, discharge and redness.  Respiratory: cough, hemoptysis, sputum production, dyspnea with exertion, wheezing and stridor.   Cardiovascular: chest pressure, palpitations, orthopnea, claudication, leg swelling and PND.  Gastrointestinal: epigastric pain, heartburn, nausea, vomiting, diarrhea, constipation, blood in stool and melena.  Genitourinary: Negative for dysuria, urgency, frequency, hematuria and flank pain.  Musculoskeletal: Negative for myalgias, back pain, joint pain and falls.  Skin: Negative for itching and rash.  Neurological: Negative for dizziness, tingling, tremors, sensory change, speech change, focal weakness, seizures, loss of consciousness, weakness and headaches.  Endo/Heme/Allergies: Negative for environmental allergies and polydipsia. Does not bruise/bleed easily.  SUBJECTIVE:  Pt c/o epigastric pain, chest pressure, and shortness of breath  VITAL SIGNS: Temp:  [97.5 F (36.4 C)-98.9 F (37.2 C)] 98.9 F (37.2 C) (11/04 2000) Pulse Rate:  [25-126] 74 (11/04 2030) Resp:  [18-33]  22 (11/04 2000) BP: (76-115)/(50-98) 76/51 (11/04 2039) SpO2:  [92 %-99 %] 96 % (11/04 2030) Weight:  [207.9 kg-216.6 kg] 207.9 kg (11/04 1728)  PHYSICAL EXAMINATION: General: acutely ill appearing male, in mild respiratory failure sitting in chair Neuro: alert and oriented, follows commands  HEENT: supple, large neck  Cardiovascular: irregular irregular, no R/G  Lungs: rhonchi throughout, slightly tachypneic with dyspnea  Abdomen: +BS x4, obese, soft, mild distension, epigastric tenderness Musculoskeletal: 1+ bilateral lower extremity edema, moves all extremities   Skin: intact no rashes or lesions   Recent Labs  Lab 05/06/19 1500 05/08/19 0355  NA 138 140  K 3.7 3.5  CL 103 101  CO2 24 26  BUN 18 17  CREATININE 1.32* 1.40*  GLUCOSE 111* 94   Recent Labs  Lab 05/06/19 1500 05/07/19 0657 05/08/19 0355  HGB 14.7 15.5 15.6  HCT 46.0 47.3 48.0  WBC 4.7 5.2 5.2  PLT 211 205 206   Dg Chest 1 View  Result Date: 05/09/2019 CLINICAL DATA:  Shortness of breath EXAM: CHEST  1 VIEW COMPARISON:  05/06/2019 FINDINGS: Cardiomegaly with vascular congestion and interstitial prominence, likely interstitial edema. Findings are similar to prior study. No effusions. No acute bony abnormality. IMPRESSION: Cardiomegaly with mild interstitial edema/CHF. Electronically Signed   By: Charlett Nose M.D.   On: 05/09/2019 20:36   Dg Abd 1 View  Result Date: 05/09/2019 CLINICAL DATA:  Abdominal distention EXAM: ABDOMEN - 1 VIEW COMPARISON:  None. FINDINGS: Nonobstructive bowel gas pattern.  No organomegaly or free air. IMPRESSION: No acute findings. Electronically Signed   By: Charlett Nose M.D.   On: 05/09/2019 20:35    ASSESSMENT / PLAN:  Acute hypoxic respiratory failure secondary to pulmonary edema, OHS,  concern of possible pulmonary embolism and suspect undiagnosed OSA Morbid obesity  Prn Bipap for dyspnea and/or hypoxia  Prn bronchodilator therapy  Repeat CXR in am  US Venous Img  Bilateral Lower Extremity pending to r/o DVT   New onset atrial fibrillation with rvr-improving  Mildly reduced LV function-Echo 05/07/2019 EF 45 to 50% Hypotension likely secondary to antihypertensive and antiarrhythmic medications  Mildly elevated troponin's likely secondary to demand ischemia in setting on new onset atrial fibrillation  Possible pulmonary embolism/DVT  Hx: HLD Continuous telemetry monitoring  Daily weights  Neo-synephrine gtt to maintain map >65  PCT pending to rule out sepsis  Hold antihypertensives, beta blockers, and cardizem; will start amiodarone gtt if unable to maintain rate control Hold iv lasix due to hypotension and worsening renal failure  US Venous Img Bilateral Lower Extremity pending to r/o DVT  VTE px: po eliquis Trend CBC, monitor for s/sx of bleeding, and transfuse for hgb <7 Cardiology consulted appreciate input recommended no further cardiac diagnostics but if pt continues to have significant issues with hypoxia consider CTA Chest, however unable to obtain at this time due to worsening renal function. Per cards pt may require outpatient cardioversion for atrial fibrillation  Prn morphine for pain management   Acute renal failure  Trend BMP  Replace electrolytes as indicated Monitor UOP  Avoid nephrotoxic medications If renal function continues to decline will consult nephrology   Constipation/Epigastric pain (KUB 05/09/19 negative) Continue bowel regimen  GI cocktail x1 dose  SUP px: IV pepcid    -Pt will need an outpatient sleep study following discharge   Discussed case and plan of care with [facility] physician Dr. Levada Schilling he agreed with current plan of care listed above  Marda Stalker, [place] Pager [phone] (please enter 7 digits) PCCM Consult Pager [phone] (please enter 7 digits)

## 2019-05-09 NOTE — Progress Notes (Signed)
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  Patient has had continued medication management for heart rate and blood pressure control increasing diltiazem and metoprolol doses throughout the day.  Heart rate much better at this time at 70 to 90 bpm but the patient had some abdominal fullness and pressure radiating into his chest with some diaphoresis and weakness.  This may be secondary to abdominal discomfort and hypotension.  The patient has been on anticoagulation for the possibility of deep venous thrombosis and pulmonary embolism and so therefore would continue this treatment as well as treatment for risk reduction of cardiovascular disease and stroke risk with atrial fibrillation.  There may be some consideration for CT angiogram at a later time if concerns that the patient had recurrent episodes  Review of Systems: Positive for: Shortness of breath abdominal fullness chest pain Negative for: Vision change, hearing change, syncope, dizziness, nausea, vomiting,diarrhea, bloody stool, stomach pain, cough, congestion, diaphoresis, urinary frequency, urinary pain,skin lesions, skin rashes Others previously listed  Objective: Telemetry: Atrial fibrillation with a rapid ventricular rate Physical Exam: Blood pressure 90/61, pulse (!) 126, temperature (!) 97.5 F (36.4 C), temperature source Axillary, resp. rate (!) 24, height 5\' 11"  (1.803 m), weight (!) 207.9 kg, SpO2 92 %. Body mass index is 63.92 kg/m. General: Well developed, well nourished, in no acute distress. Head: Normocephalic, atraumatic, sclera non-icteric, no xanthomas, nares are without discharge. Neck: No apparent masses Lungs: Normal respirations with some  wheezes, no rhonchi, no rales , few crackles   Heart: Regular rate and rhythm, normal S1 S2, no murmur, no rub, no gallop, PMI is normal size and placement, carotid upstroke normal without bruit, jugular venous pressure normal Abdomen: Soft, non-tender, non-distended with normoactive bowel sounds. No hepatosplenomegaly. Abdominal aorta is normal size without bruit Extremities: 1+ edema, no clubbing, no cyanosis, no ulcers,  Peripheral: 2+ radial, 2+ femoral, 2+ dorsal pedal pulses Neuro: Alert and oriented. Moves all extremities spontaneously. Psych:  Responds to questions appropriately with a normal affect.   Intake/Output Summary (Last 24 hours) at 05/09/2019 1747 Last data filed at 05/09/2019 1402 Gross per 24 hour  Intake 240 ml  Output 1150 ml  Net -910 ml    Inpatient Medications:  . amLODipine  5 mg Oral Daily  . apixaban  5 mg Oral BID  . Chlorhexidine Gluconate Cloth  6 each Topical Daily  . diltiazem  240 mg Oral Daily  . losartan  50 mg Oral Daily  . metoprolol tartrate  100 mg Oral BID  . sodium chloride flush  3 mL Intravenous Q12H   Infusions:  . sodium chloride      Labs: Recent Labs    05/06/19 1851 05/08/19 0355  NA  --  140  K  --  3.5  CL  --  101  CO2  --  26  GLUCOSE  --  94  BUN  --  17  CREATININE  --  1.40*  CALCIUM  --  9.4  MG 2.0 1.9   No results for input(s): AST, ALT, ALKPHOS, BILITOT, PROT, ALBUMIN in the last 72 hours. Recent Labs    05/07/19 0657 05/08/19 0355  WBC 5.2 5.2  HGB 15.5 15.6  HCT 47.3 48.0  MCV 89.8 90.9  PLT 205  206   No results for input(s): CKTOTAL, CKMB, TROPONINI in the last 72 hours. Invalid input(s): POCBNP No results for input(s): HGBA1C in the last 72 hours.   Weights: Filed Weights   05/08/19 2103 05/09/19 0554 05/09/19 1728  Weight: (!) 216.5 kg (!) 216.6 kg (!) 207.9 kg     Radiology/Studies:  Dg Chest Portable 1 View  Result Date: 05/06/2019 CLINICAL DATA:  Tachycardia. Shortness of breath.  Palpitations. Abnormal EKG. EXAM: PORTABLE CHEST 1 VIEW COMPARISON:  None. FINDINGS: Mild to moderate cardiomegaly seen. Pulmonary vascular congestion and probable mild interstitial edema noted. No evidence of pulmonary consolidation or pleural effusion. IMPRESSION: Cardiomegaly and pulmonary vascular congestion, with probable mild interstitial edema. Electronically Signed   By: Danae Orleans M.D.   On: 05/06/2019 15:30     Assessment and Recommendation  43 y.o. 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  Continue current oral medication management for heart rate control of atrial fibrillation below 110 bpm 2.  Continuation of anticoagulation for further risk reduction stroke with atrial fibrillation as well as helping with the reduce the possibility of patient is having pulmonary embolism treatment 3.  Further hypertension control with losartan although due to relative hypotension with additional medications management of heart rate control will discontinue losartan at this time 4.  Continue observation at this time for improvements of symptoms and also concerned and consider dose of intravenous Lasix for pulmonary edema 5.  No further cardiac diagnostics necessary at this time although will consider CT angiogram if patient continues to have significant issues or hypoxia at also consideration for repeat troponin Signed, Arnoldo Hooker M.D. FACC

## 2019-05-09 NOTE — Progress Notes (Addendum)
  SUBJECTIVE: Per nursing staff, patient continues to complain of chest and abdominal discomfort without associated nausea, vomiting, diaphoresis, dizziness or radiation to extremities. Patient's wife at the bedside is concerned that he is " getting worse" and would like to him to be transferred to [facility] or [facility] closer to her work.  OBJECTIVE: On assessment, he was afebrile with blood pressure 85/73 mm Hg and pulse rate 42 beats/min. There were no focal neurological deficits; he was alert and oriented x4, but appeared to be in discomfort. Physical assessment unchanged from HPI.  ASSESSMENT: 43 y.o male with past medical hx of HTN, CKD, and morbid obesity presenting with new onset atrial fibrillation with RVR.  PLAN:  1. New onset AFib+RVR, Unstable (hypotensive, angina) - Continues to c/o chest and abdominal discomfort - Will obtain CTA chest/abdomen/ pelvis to evaluate for PE or dissection - Check TSH, T4 - EtOH negative - TSH, FT4 - Check Lactic acid  - Repeat CXR shows mild interstitial edema, xray of abdomen with no acute finding - TTEcho with mildly reduced LV function and EF of 45-50%. - Decrease Metoprolol to 50 mg PO bid given hypotension, if worsening hypotension and bradycardia consider holding metoprolol. - Cardiology input appreciated - Consult placed to Intensivist for management   2. Thromboembolism Risk Management - CHADS2 = 2 - Continue Eliquis per Cardiology  3. Chest pain with Elevated troponin  - Likely demand ischemia from afib with RVR - Continue to trend - CTA as above - Morphine PRN, Nitro as BP allows  4. Acute Systolic Congestive Heart Failure: BNP mildly elevated at 129 - Holding ACE inhibitor in the setting of hypotension - Continue Metoprolol - Holding Lasix in the setting of hypotension, may need gentle diuresis once BP stable - Low salt diet  - Check daily weight - Strict I&Os - CHF Teaching - Cardiology input appreciated  5. Acute Kidney Injury on  CKD - Cr slightly elevated above baseline - Hold nephrotoxins - Continue to monitor renal function   Discussed plan of care with patient's wife at the bedside and answered all her questions and concerns. Informed her that currently there is no medical reason to transfer patient as he is receiving best possible care, however should anything change that require higher level of care we would definitely transfer. She verbalized understanding.    Rufina Falco, DNP, CCRN, FNP-BC [facility] Nurse Practitioner Between 7am to 6pm - Pager - [phone]  After 6pm go to [email or web address] - Proofreader  [facility] [employer]  [phone]

## 2019-05-09 NOTE — Progress Notes (Signed)
PROGRESS NOTE    Joshua ConteShawn Hofman  NWG:[id number] DOB: [birth year] DOA: 05/06/2019 PCP: System, Pcp Not In    Brief Narrative:   Joshua Sampson is a 43 y.o. male with medical history significant for morbid obesity, hypertension and cluster headaches.  Patient states he is overall been doing well.  Through keto diet, he has lost 10 to 15 pounds lately intentionally.  He says in the last couple days has not been sleeping great, but overall he sleeps okay.  He is unsure if he snores, sleeps about 5 to 6 hours a night and says he does feel well rested.  Started having a cluster headache 3 days ago.  Intermittent, lasting about 10 minutes, but he had several repeatedly for the past few days including today so he came into the urgent care.  Prior to coming in, patient said he felt some mild chest palpitations and a little short of breath, but that quickly resolved.   43 y.o. 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  Interim History:  Pt felt good in morning.  He did not have chest pain, shortness of breath.  He has nausea, no vomiting, diarrhea or abdominal pain.  Patient is constipated.  Patient has 1 leg edema in morning. Pt develop chest pain again in later afternoon.  Stat troponin 28. Had hypotension with SBP in upper 80s which improved to SBP>90s.  Assessment & Plan:   Principal Problem:   Atrial flutter with rapid ventricular response (HCC) Active Problems:   Morbid obesity (HCC)   Hypertension   Headache   CKD (chronic kidney disease), stage II   Chest pain   Elevated troponin  New onset atrial flutter with rapid ventricular response [facility](HCC): card was consulted. CHA2DS2-VASc Score is 2, needs oral anticoagulation. Patient is started with Eliquis. ECHO with mildly reduced LV function and EF of  45-50%. - Eliquis 5 mg BID for anticoagulation  - Continue diltiazem 240 mg daily and metoprolol 100 mg bid  New Systolic CHF: ECHO with mildly reduced LV function and EF of 45-50%. No respiratory distress -on IV lasix 40 mg bid  Chest pain and troponin elevation:  Patient with mildly elevated HS-troponin to 63 initially, then 62 on repeat. Pt develop chest pain again in later afternoon today.  Stat troponin 28. Had hypotension with SBP in upper 80s which improved to SBP>90s. -will get stat EKG - trend trop - on eliquis, metoprolol -Dr. Gwen PoundsKowalski is aware this issuemor  Morbid obesity: BMI 63.92 -Diet and exercise.   -Encouraged to lose weight.   Hypertension: Bp is soft now. -give 500 cc of IV NS -On metoprolol and Cardizem which is for A-flutter  CKD (chronic kidney disease), stage II: baseline Cre 1.2. his Cre is 1.4 and BUN 17, slightly worsening. -f/u by BMP  DVT prophylaxis: Eliquis Code Status: Full code Family Communication: None Disposition Plan: Possible discharge to home tomorrow Barriers for discharge:  Developed chest pain again this afternoon   Consultants:   card  Procedures:    Antimicrobials:    Subjective:  Pt felt good in morning.  He did not have chest pain, shortness of breath.  He has nausea, no vomiting, diarrhea or abdominal pain.  Patient is constipated.  Patient has 1 leg edema in morning. Pt develop chest pain again in later afternoon.  Stat troponin 28.  Had hypotension with SBP in upper 80s which improved to SBP>90s.  Objective: Vitals:   05/09/19 1710 05/09/19 1719 05/09/19 1728 05/09/19 1816  BP: 96/62 (!) 84/67 90/61 (!) 85/57  Pulse: (!) 120 (!) 126  (!) 25  Resp:   (!) 24 (!) 33  Temp:   (!) 97.5 F (36.4 C)   TempSrc:   Axillary   SpO2:    95%  Weight:   (!) 207.9 kg   Height:    (1.803 m)     Intake/Output Summary (Last 24 hours) at 05/09/2019 1913 Last data filed at 05/09/2019 1900 Gross per 24 hour  Intake 742.11  ml  Output 1150 ml  Net -407.89 ml   Filed Weights   05/08/19 2103 05/09/19 0554 05/09/19 1728  Weight: (!) 216.5 kg (!) 216.6 kg (!) 207.9 kg    Examination: Physical Exam:  General: Not in acute distress HEENT: PERRL, EOMI, no scleral icterus, No JVD or bruit Cardiac: S1/S2, RRR, No murmurs, gallops or rubs Pulm: Clear to auscultation bilaterally. No rales, wheezing, rhonchi or rubs. Abd: Soft, nondistended, nontender, no rebound pain, no organomegaly, BS present Ext: 1+ leg pitting edema bilaterally. 2+DP/PT pulse bilaterally Musculoskeletal: No joint deformities, erythema, or stiffness, ROM full Skin: No rashes.  Neuro: Alert and oriented X3, cranial nerves II-XII grossly intact. Psych: Patient is not psychotic, no suicidal or hemocidal ideation.    Data Reviewed: I have personally reviewed following labs and imaging studies  CBC: Recent Labs  Lab 05/06/19 1500 05/07/19 0657 05/08/19 0355  WBC 4.7 5.2 5.2  NEUTROABS 2.9  --   --   HGB 14.7 15.5 15.6  HCT 46.0 47.3 48.0  MCV 91.5 89.8 90.9  PLT 211 205 206   Basic Metabolic Panel: Recent Labs  Lab 05/06/19 1500 05/06/19 1851 05/08/19 0355  NA 138  --  140  K 3.7  --  3.5  CL 103  --  101  CO2 24  --  26  GLUCOSE 111*  --  94  BUN 18  --  17  CREATININE 1.32*  --  1.40*  CALCIUM 9.0  --  9.4  MG  --  2.0 1.9   GFR: Estimated Creatinine Clearance: 120.9 mL/min (A) (by C-G formula based on SCr of 1.4 mg/dL (H)). Liver Function Tests: No results for input(s): AST, ALT, ALKPHOS, BILITOT, PROT, ALBUMIN in the last 168 hours. No results for input(s): LIPASE, AMYLASE in the last 168 hours. No results for input(s): AMMONIA in the last 168 hours. Coagulation Profile: Recent Labs  Lab 05/06/19 1935  INR 1.3*   Cardiac Enzymes: No results for input(s): CKTOTAL, CKMB, CKMBINDEX, TROPONINI in the last 168 hours. BNP (last 3 results) No results for input(s): PROBNP in the last 8760 hours. HbA1C: No results  for input(s): HGBA1C in the last 72 hours. CBG: Recent Labs  Lab 05/06/19 1823 05/09/19 1727  GLUCAP 96 153*   Lipid Profile: No results for input(s): CHOL, HDL, LDLCALC, TRIG, CHOLHDL, LDLDIRECT in the last 72 hours. Thyroid Function Tests: No results for input(s): TSH, T4TOTAL, FREET4, T3FREE, THYROIDAB in the last 72 hours. Anemia Panel: No results for input(s): VITAMINB12, FOLATE, FERRITIN, TIBC, IRON, RETICCTPCT in the last 72 hours. Sepsis Labs: No results for input(s): PROCALCITON, LATICACIDVEN in the last 168 hours.  Recent Results (from the past 240 hour(s))  MRSA PCR Screening     Status: None   Collection Time: 05/06/19  2:19 PM   Specimen: Nasopharyngeal  Result  Value Ref Range Status   MRSA by PCR NEGATIVE NEGATIVE Final    Comment:        The GeneXpert MRSA Assay (FDA approved for NASAL specimens only), is one component of a comprehensive MRSA colonization surveillance program. It is not intended to diagnose MRSA infection nor to guide or monitor treatment for MRSA infections. Performed at [facility], [address] [place], [place] [address]   SARS Coronavirus 2 by RT PCR (hospital order, performed in [facility] hospital lab) Nasopharyngeal Nasopharyngeal Swab     Status: None   Collection Time: 05/06/19  4:24 PM   Specimen: Nasopharyngeal Swab  Result Value Ref Range Status   SARS Coronavirus 2 NEGATIVE NEGATIVE Final    Comment: (NOTE) If result is NEGATIVE SARS-CoV-2 target nucleic acids are NOT DETECTED. The SARS-CoV-2 RNA is generally detectable in upper and lower  respiratory specimens during the acute phase of infection. The lowest  concentration of SARS-CoV-2 viral copies this assay can detect is 250  copies / mL. A negative result does not preclude SARS-CoV-2 infection  and should not be used as the sole basis for treatment or other  patient management decisions.  A negative result may occur with  improper specimen collection /  handling, submission of specimen other  than nasopharyngeal swab, presence of viral mutation(s) within the  areas targeted by this assay, and inadequate number of viral copies  (<250 copies / mL). A negative result must be combined with clinical  observations, patient history, and epidemiological information. If result is POSITIVE SARS-CoV-2 target nucleic acids are DETECTED. The SARS-CoV-2 RNA is generally detectable in upper and lower  respiratory specimens dur ing the acute phase of infection.  Positive  results are indicative of active infection with SARS-CoV-2.  Clinical  correlation with patient history and other diagnostic information is  necessary to determine patient infection status.  Positive results do  not rule out bacterial infection or co-infection with other viruses. If result is PRESUMPTIVE POSTIVE SARS-CoV-2 nucleic acids MAY BE PRESENT.   A presumptive positive result was obtained on the submitted specimen  and confirmed on repeat testing.  While 2019 novel coronavirus  (SARS-CoV-2) nucleic acids may be present in the submitted sample  additional confirmatory testing may be necessary for epidemiological  and / or clinical management purposes  to differentiate between  SARS-CoV-2 and other Sarbecovirus currently known to infect humans.  If clinically indicated additional testing with an alternate test  methodology [phone]) is advised. The SARS-CoV-2 RNA is generally  detectable in upper and lower respiratory sp ecimens during the acute  phase of infection. The expected result is Negative. Fact Sheet for Patients:  StrictlyIdeas.no Fact Sheet for Healthcare Providers: BankingDealers.co.za This test is not yet approved or cleared by the [place] FDA and has been authorized for detection and/or diagnosis of SARS-CoV-2 by FDA under an Emergency Use Authorization (EUA).  This EUA will remain in effect (meaning this  test can be used) for the duration of the COVID-19 declaration under Section 564(b)(1) of the Act, 21 U.S.C. section 360bbb-3(b)(1), unless the authorization is terminated or revoked sooner. Performed at [facility], [address] [place], [place] [address]       Radiology Studies: No results found.      Scheduled Meds: . apixaban  5 mg Oral BID  . Chlorhexidine Gluconate Cloth  6 each Topical Daily  . diltiazem  240 mg Oral Daily  . furosemide  40 mg Intravenous BID  .  metoprolol tartrate  100 mg Oral BID  . senna-docusate  1 tablet Oral BID  . sodium chloride flush  3 mL Intravenous Q12H   Continuous Infusions:   LOS: 3 days    Time spent: 30 min    Lorretta Harp, DO [facility] PAGER is on AMION  If 7PM-7AM, please contact night-coverage [email or web address] Password [facility] 05/09/2019, 7:13 PM

## 2019-05-09 NOTE — Significant Event (Signed)
Rapid Response Event Note  Overview: Time Called: 1703 Arrival Time: 1706 Event Type: Cardiac, Hypotension  Initial Focused Assessment: called for rapid response for cp and hypotention, pt diaphoretic and complaining of mid sternal chest pain. Soft BP.   Interventions: stat ekg, stat troponin and transfer to stepdown for closer monitoring, Dr Nehemiah Massed in cath lab at this time and made aware.  Plan of Care (if not transferred):  Event Summary: Name of Physician Notified: Niu at 1710    at    Outcome: Transferred (Comment)(to stepdown for closer monitoring)  Event End Time: Alfred

## 2019-05-09 NOTE — Progress Notes (Signed)
Joshua Sampson given updated on EKG performed, pt S/S, and response to nitro. Will come see patient in the ICU to assess.

## 2019-05-09 NOTE — Progress Notes (Signed)
Pt c/o indigestion / RN to bedside to assess/ maalox given / while at bedside pt began to c/o 10/10 chest tightness, sob,  and became very diaphoretic/ nitro given/ o2 applied/ EKG obtained/ VS taken - soft BP/ rapid response called/ MD aware/ pt transferred to CCU for closer monitoring.

## 2019-05-10 ENCOUNTER — Inpatient Hospital Stay: Payer: BC Managed Care – PPO

## 2019-05-10 DIAGNOSIS — I959 Hypotension, unspecified: Secondary | ICD-10-CM

## 2019-05-10 LAB — CBC WITH DIFFERENTIAL/PLATELET
Abs Immature Granulocytes: 0.02 10*3/uL (ref 0.00–0.07)
Basophils Absolute: 0 10*3/uL (ref 0.0–0.1)
Basophils Relative: 1 %
Eosinophils Absolute: 0 10*3/uL (ref 0.0–0.5)
Eosinophils Relative: 1 %
HCT: 46.4 % (ref 39.0–52.0)
Hemoglobin: 14.9 g/dL (ref 13.0–17.0)
Immature Granulocytes: 0 %
Lymphocytes Relative: 22 %
Lymphs Abs: 1.5 10*3/uL (ref 0.7–4.0)
MCH: 29.6 pg (ref 26.0–34.0)
MCHC: 32.1 g/dL (ref 30.0–36.0)
MCV: 92.1 fL (ref 80.0–100.0)
Monocytes Absolute: 0.6 10*3/uL (ref 0.1–1.0)
Monocytes Relative: 10 %
Neutro Abs: 4.3 10*3/uL (ref 1.7–7.7)
Neutrophils Relative %: 66 %
Platelets: 243 10*3/uL (ref 150–400)
RBC: 5.04 MIL/uL (ref 4.22–5.81)
RDW: 12.2 % (ref 11.5–15.5)
WBC: 6.5 10*3/uL (ref 4.0–10.5)
nRBC: 0 % (ref 0.0–0.2)

## 2019-05-10 LAB — COMPREHENSIVE METABOLIC PANEL
ALT: 99 U/L — ABNORMAL HIGH (ref 0–44)
ALT: 88 U/L — ABNORMAL HIGH (ref 0–44)
AST: 54 U/L — ABNORMAL HIGH (ref 15–41)
AST: 38 U/L (ref 15–41)
Albumin: 3.7 g/dL (ref 3.5–5.0)
Albumin: 3.4 g/dL — ABNORMAL LOW (ref 3.5–5.0)
Alkaline Phosphatase: 49 U/L (ref 38–126)
Alkaline Phosphatase: 51 U/L (ref 38–126)
Anion gap: 11 (ref 5–15)
Anion gap: 12 (ref 5–15)
BUN: 28 mg/dL — ABNORMAL HIGH (ref 6–20)
BUN: 30 mg/dL — ABNORMAL HIGH (ref 6–20)
CO2: 24 mmol/L (ref 22–32)
CO2: 24 mmol/L (ref 22–32)
Calcium: 8.8 mg/dL — ABNORMAL LOW (ref 8.9–10.3)
Calcium: 8.9 mg/dL (ref 8.9–10.3)
Chloride: 100 mmol/L (ref 98–111)
Chloride: 101 mmol/L (ref 98–111)
Creatinine, Ser: 2.39 mg/dL — ABNORMAL HIGH (ref 0.61–1.24)
Creatinine, Ser: 2.1 mg/dL — ABNORMAL HIGH (ref 0.61–1.24)
GFR calc Af Amer: 37 mL/min — ABNORMAL LOW (ref >60)
GFR calc Af Amer: 43 mL/min — ABNORMAL LOW (ref >60)
GFR calc non Af Amer: 32 mL/min — ABNORMAL LOW (ref >60)
GFR calc non Af Amer: 37 mL/min — ABNORMAL LOW (ref >60)
Glucose, Bld: 113 mg/dL — ABNORMAL HIGH (ref 70–99)
Glucose, Bld: 100 mg/dL — ABNORMAL HIGH (ref 70–99)
Potassium: 4.6 mmol/L (ref 3.5–5.1)
Potassium: 4.3 mmol/L (ref 3.5–5.1)
Sodium: 135 mmol/L (ref 135–145)
Sodium: 137 mmol/L (ref 135–145)
Total Bilirubin: 1.5 mg/dL — ABNORMAL HIGH (ref 0.3–1.2)
Total Bilirubin: 1.4 mg/dL — ABNORMAL HIGH (ref 0.3–1.2)
Total Protein: 7.1 g/dL (ref 6.5–8.1)
Total Protein: 6.9 g/dL (ref 6.5–8.1)

## 2019-05-10 LAB — URINALYSIS, COMPLETE (UACMP) WITH MICROSCOPIC
Bacteria, UA: NONE SEEN
Bilirubin Urine: NEGATIVE
Glucose, UA: NEGATIVE mg/dL
Hgb urine dipstick: NEGATIVE
Ketones, ur: NEGATIVE mg/dL
Leukocytes,Ua: NEGATIVE
Nitrite: NEGATIVE
Protein, ur: NEGATIVE mg/dL
Specific Gravity, Urine: 1.008 (ref 1.005–1.030)
pH: 5 (ref 5.0–8.0)

## 2019-05-10 LAB — HEMOGLOBIN A1C
Hgb A1c MFr Bld: 5.3 % (ref 4.8–5.6)
Mean Plasma Glucose: 105.41 mg/dL

## 2019-05-10 LAB — GLUCOSE, CAPILLARY
Glucose-Capillary: 115 mg/dL — ABNORMAL HIGH (ref 70–99)
Glucose-Capillary: 98 mg/dL (ref 70–99)
Glucose-Capillary: 93 mg/dL (ref 70–99)
Glucose-Capillary: 77 mg/dL (ref 70–99)
Glucose-Capillary: 105 mg/dL — ABNORMAL HIGH (ref 70–99)
Glucose-Capillary: 99 mg/dL (ref 70–99)

## 2019-05-10 LAB — T4, FREE: Free T4: 1.27 ng/dL — ABNORMAL HIGH (ref 0.61–1.12)

## 2019-05-10 LAB — TSH: TSH: 1.51 u[IU]/mL (ref 0.350–4.500)

## 2019-05-10 LAB — TROPONIN I (HIGH SENSITIVITY): Troponin I (High Sensitivity): 35 ng/L — ABNORMAL HIGH (ref <18)

## 2019-05-10 LAB — LACTIC ACID, PLASMA: Lactic Acid, Venous: 2 mmol/L (ref 0.5–1.9)

## 2019-05-10 LAB — MAGNESIUM: Magnesium: 2.2 mg/dL (ref 1.7–2.4)

## 2019-05-10 MED ORDER — SODIUM CHLORIDE 0.9 % IV SOLN
25.0000 ug/min | INTRAVENOUS | Status: DC
  Administered 2019-05-10: 32 ug/min via INTRAVENOUS
  Administered 2019-05-10: 38 ug/min via INTRAVENOUS
  Administered 2019-05-10: 22:00:00 30 ug/min via INTRAVENOUS
  Filled 2019-05-10 (×3): qty 10

## 2019-05-10 MED ORDER — LACTATED RINGERS IV BOLUS
1000.0000 mL | Freq: Once | INTRAVENOUS | Status: AC
  Administered 2019-05-10: 11:00:00 1000 mL via INTRAVENOUS

## 2019-05-10 MED ORDER — AMIODARONE HCL 200 MG PO TABS
200.0000 mg | ORAL_TABLET | Freq: Every day | ORAL | Status: DC
  Administered 2019-05-10: 22:00:00 200 mg via ORAL
  Filled 2019-05-10: qty 1

## 2019-05-10 MED ORDER — APIXABAN 5 MG PO TABS
5.0000 mg | ORAL_TABLET | Freq: Two times a day (BID) | ORAL | Status: DC
  Administered 2019-05-10 – 2019-05-16 (×12): 5 mg via ORAL
  Filled 2019-05-10 (×12): qty 1

## 2019-05-10 MED ORDER — ORAL CARE MOUTH RINSE
15.0000 mL | Freq: Two times a day (BID) | OROMUCOSAL | Status: DC
  Administered 2019-05-12 – 2019-05-15 (×6): 15 mL via OROMUCOSAL

## 2019-05-10 MED ORDER — PANTOPRAZOLE SODIUM 40 MG IV SOLR
40.0000 mg | INTRAVENOUS | Status: DC
  Administered 2019-05-10 – 2019-05-15 (×6): 40 mg via INTRAVENOUS
  Filled 2019-05-10 (×6): qty 40

## 2019-05-10 MED ORDER — PHENYLEPHRINE HCL-NACL 10-0.9 MG/250ML-% IV SOLN
25.0000 ug/min | INTRAVENOUS | Status: DC
  Filled 2019-05-10: qty 250

## 2019-05-10 MED ORDER — SODIUM CHLORIDE 0.9 % IV SOLN
250.0000 mL | INTRAVENOUS | Status: DC
  Administered 2019-05-10: 12:00:00 250 mL via INTRAVENOUS

## 2019-05-10 NOTE — Progress Notes (Signed)
Ch visited w/ pt during rounds to f/u to see how the pt was progressing since the encounter that led to him being placed back in ICU. The pt is a 43 y.o. that has been recently diagnosed with Afib. Pt shared that he was experiencing pain in his chest and lower abdomen area while ch was visiting with him. Pt was also on a BiPap but was able to hold a brief conversation w/o presenting labored breathing. [place] checked in with pt nurse and also w/ pt's wife via telephone. The wife was concerned about the treatment plan for the pt but the ch affirmed that the pt was alert and stable during the time of contacting her. Pt's wife requested to stay over night bedside with pt. Ch informed the wife that it is not likely but she could always ask the charge nurse to see what will be allowed. Wife understood.  Ch will f/u with pt for social and spiritual support.    05/10/19 1300  Clinical Encounter Type  Visited With Patient;Family;Health care provider  Visit Type Follow-up;Spiritual support;Social support;Critical Care  Spiritual Encounters  Spiritual Needs Emotional;Grief support  Stress Factors  Patient Stress Factors Health changes;Loss of control;Major life changes  Family Stress Factors Loss of control;Major life changes

## 2019-05-10 NOTE — Progress Notes (Signed)
[facility] Encounter Note  Patient: Joshua Sampson / Admit Date: 05/06/2019 / Date of Encounter: 05/10/2019, 8:09 AM   Subjective: Patient has had atrial fibrillation with rapid ventricular rate and combined systolic diastolic dysfunction congestive heart failure with pulmonary edema by chest x-ray.  Patient has had continued medication management for heart rate and blood pressure control increasing diltiazem and metoprolol doses throughout yesterday heart rate much better at this time at 70 to 90 bpm but the patient had some abdominal fullness and pressure radiating into his chest with some diaphoresis and weakness last night.  This may be secondary to abdominal discomfort and hypotension.  The patient has been on anticoagulation for the possibility of deep venous thrombosis and pulmonary embolism and so therefore would continue this treatment as well as treatment for risk reduction of cardiovascular disease and stroke risk with atrial fibrillation.  This abdominal and chest discomfort had completely resolved with intravenous Lasix and BiPAP treatment and is now not an issue.  The patient did have a troponin which was unchanged from before including a peak of 65.  Additionally BNP was 275 not significantly changed or in consideration for worsening heart failure.  Therefore the majority of his symptoms were likely abdominal rather than cardiac in nature.  There may be some consideration for CT angiogram at a later time if concerns that the patient had recurrent episodes  Review of Systems: Positive for: Shortness of breath  Negative for: Vision change, hearing change, syncope, dizziness, nausea, vomiting,diarrhea, bloody stool, stomach pain, cough, congestion, diaphoresis, urinary frequency, urinary pain,skin lesions, skin rashes Others previously listed  Objective: Telemetry: Atrial fibrillation with a rapid ventricular rate Physical Exam: Blood pressure (!) 109/93, pulse (!) 102,  temperature 97.9 F (36.6 C), temperature source Axillary, resp. rate 19, height 5\' 11"  (1.803 m), weight (!) 208.7 kg, SpO2 96 %. Body mass index is 64.17 kg/m. General: Well developed, well nourished, in no acute distress. Head: Normocephalic, atraumatic, sclera non-icteric, no xanthomas, nares are without discharge. Neck: No apparent masses Lungs: Normal respirations with some wheezes, no rhonchi, no rales , few crackles   Heart: Irregular rate and rhythm, normal S1 S2, no murmur, no rub, no gallop, PMI is normal size and placement, carotid upstroke normal without bruit, jugular venous pressure normal Abdomen: Soft, non-tender, distended with normoactive bowel sounds. No hepatosplenomegaly. Abdominal aorta is normal size without bruit Extremities: 1+ edema, no clubbing, no cyanosis, no ulcers,  Peripheral: 2+ radial, 2+ femoral, 2+ dorsal pedal pulses Neuro: Alert and oriented. Moves all extremities spontaneously. Psych:  Responds to questions appropriately with a normal affect.   Intake/Output Summary (Last 24 hours) at 05/10/2019 0809 Last data filed at 05/10/2019 0600 Gross per 24 hour  Intake 1079.72 ml  Output 475 ml  Net 604.72 ml    Inpatient Medications:  . alum & mag hydroxide-simeth  30 mL Oral Once   And  . lidocaine  15 mL Oral Once  . apixaban  5 mg Oral BID  . Chlorhexidine Gluconate Cloth  6 each Topical Daily  . pantoprazole (PROTONIX) IV  40 mg Intravenous Q24H  . senna-docusate  1 tablet Oral BID  . sodium chloride flush  3 mL Intravenous Q12H   Infusions:  . phenylephrine (NEO-SYNEPHRINE) Adult infusion 30 mcg/min (05/10/19 0600)    Labs: Recent Labs    05/09/19 2105 05/10/19 0200 05/10/19 0208  NA 134*  --  135  K 3.6  --  4.6  CL 99  --  100  CO2 20*  --  24  GLUCOSE 165*  --  113*  BUN 25*  --  28*  CREATININE 2.41*  --  2.39*  CALCIUM 8.7*  --  8.8*  MG 2.2 2.2  --   PHOS 4.8*  --   --    Recent Labs    05/10/19 0208  AST 54*  ALT 99*   ALKPHOS 49  BILITOT 1.5*  PROT 7.1  ALBUMIN 3.7   Recent Labs    05/08/19 0355 05/10/19 0208  WBC 5.2 6.5  NEUTROABS  --  4.3  HGB 15.6 14.9  HCT 48.0 46.4  MCV 90.9 92.1  PLT 206 243   No results for input(s): CKTOTAL, CKMB, TROPONINI in the last 72 hours. Invalid input(s): POCBNP Recent Labs    05/10/19 0200  HGBA1C 5.3     Weights: Filed Weights   05/09/19 0554 05/09/19 1728 05/10/19 0422  Weight: (!) 216.6 kg (!) 207.9 kg (!) 208.7 kg     Radiology/Studies:  Dg Chest 1 View  Result Date: 05/09/2019 CLINICAL DATA:  Shortness of breath EXAM: CHEST  1 VIEW COMPARISON:  05/06/2019 FINDINGS: Cardiomegaly with vascular congestion and interstitial prominence, likely interstitial edema. Findings are similar to prior study. No effusions. No acute bony abnormality. IMPRESSION: Cardiomegaly with mild interstitial edema/CHF. Electronically Signed   By: Charlett NoseKevin  Dover M.D.   On: 05/09/2019 20:36   Dg Abd 1 View  Result Date: 05/09/2019 CLINICAL DATA:  Abdominal distention EXAM: ABDOMEN - 1 VIEW COMPARISON:  None. FINDINGS: Nonobstructive bowel gas pattern.  No organomegaly or free air. IMPRESSION: No acute findings. Electronically Signed   By: Charlett NoseKevin  Dover M.D.   On: 05/09/2019 20:35   Dg Chest Portable 1 View  Result Date: 05/06/2019 CLINICAL DATA:  Tachycardia. Shortness of breath. Palpitations. Abnormal EKG. EXAM: PORTABLE CHEST 1 VIEW COMPARISON:  None. FINDINGS: Mild to moderate cardiomegaly seen. Pulmonary vascular congestion and probable mild interstitial edema noted. No evidence of pulmonary consolidation or pleural effusion. IMPRESSION: Cardiomegaly and pulmonary vascular congestion, with probable mild interstitial edema. Electronically Signed   By: Danae OrleansJohn A Stahl M.D.   On: 05/06/2019 15:30     Assessment and Recommendation  43 y.o. male with morbid obesity hypertension hyperlipidemia having an acute on set of acute combined systolic diastolic dysfunction congestive heart  failure with atrial fibrillation with rapid ventricular rate without evidence of myocardial infarction but now with some mild amount of chest fullness and abdominal discomfort of unknown etiology possibly secondary to abdominal pain now completely resolved today without evidence of issue with myocardial infarction or worsening congestive heart failure 1.  Continue current oral medication management for heart rate control of atrial fibrillation below 110 bpm which is successful and should be unchanged at this point 2.  Continuation of anticoagulation for further risk reduction stroke with atrial fibrillation as well as helping with the reduce the possibility of patient is having pulmonary embolism treatment 3.  Abstain from angiotensin receptor blocker at this time due to concerns of hypotension yesterday 4.  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  No further cardiac diagnostics necessary at this time although will consider CT angiogram if patient continues to have significant issues or hypoxia 6.  Begin ambulation and follow for improvements of symptoms and okay for discharge to home when improving with obesity shortness of breath and physical therapy with follow-up next week for further adjustments of medications  7.  Call if further questions  signed, Arnoldo Hooker M.D. FACC

## 2019-05-10 NOTE — Progress Notes (Signed)
Updated pts wife Jayse Hodkinson regarding pt condition and current plan of care all questions were answered.  Marda Stalker, [place] Pager [phone] (please enter 7 digits) PCCM Consult Pager [phone] (please enter 7 digits)

## 2019-05-10 NOTE — Progress Notes (Signed)
PROGRESS NOTE    Joshua ConteShawn Janis  ZOX:[id number] DOB: 14-Aug-1975 DOA: 05/06/2019 PCP: System, Pcp Not In    Brief Narrative:   Joshua Sampson is a 43 y.o. male with medical history significant for morbid obesity, hypertension and cluster headaches.  Patient states he is overall been doing well.  Through keto diet, he has lost 10 to 15 pounds lately intentionally.  He says in the last couple days has not been sleeping great, but overall he sleeps okay.  He is unsure if he snores, sleeps about 5 to 6 hours a night and says he does feel well rested.  Started having a cluster headache 3 days ago.  Intermittent, lasting about 10 minutes, but he had several repeatedly for the past few days including today so he came into the urgent care.  Prior to coming in, patient said he felt some mild chest palpitations and a little short of breath, but that quickly resolved.   43 y.o. 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  Interim History:  1. Pt developed hypotension with blood pressure 85/73 mm Hg and pulse rate 42 beats/min. PCCM was consulted.  Vasopressor, Neo was stared 2. He had chest pain and abdominal pain last night, denies chest pain and abdominal pain this morning.   Assessment & Plan:   Principal Problem:   Atrial flutter with rapid ventricular response (HCC) Active Problems:   Morbid obesity (HCC)   Hypertension   Headache   CKD (chronic kidney disease), stage II   Chest pain   Elevated troponin   Hypotension  Hypotension: Patient's mental status is normal.  Etiology is not clear.  May be related to multiple hypertensive medications.  No fever or leukocytosis, low suspicion for sepsis.  PCCM was consulted.  Vasopressor, Neo was started.   CXR showed cardiomegaly and vascular congestion  without infiltration. -f/u PCCM recommendations -Continue BIPAP -Blood cultures  -Fluid challenge  New onset atrial flutter with rapid ventricular response [facility](HCC): card was consulted. CHA2DS2-VASc Score is 2, needs oral anticoagulation. Patient is started with Eliquis. ECHO with mildly reduced LV function and EF of 45-50%. - Eliquis 5 mg BID for anticoagulation  - hold  diltiazem 240 mg daily and metoprolol 100 mg bid due to hypotension  New Systolic CHF: ECHO with mildly reduced LV function and EF of 45-50%. No respiratory distress -on IV lasix 40 mg bid --> hold lasix due to hypotension  Chest pain and troponin elevation:  Patient with mildly elevated HS-troponin to 63 initially, then 62 on repeat. Pt develop chest pain again in later afternoon today.  Stat troponin 28. Had hypotension with SBP in upper 80s which improved to SBP>90s. -will get stat EKG - trend trop - on eliquis,  -Dr. Gwen PoundsKowalski is on board   Morbid obesity: BMI 63.92 -Diet and exercise.   -Encouraged to lose weight.   Hypertension: now has hypotension -hold Bp meds  CKD (chronic kidney disease), stage II: worsening. baseline Cre 1.2. his Cre is 2.39 and BUN 28. May be due to ATN secondary to hypotension and continuation of diuretics. -hold diuretics -On vasopressor -f/u by BMP  DVT prophylaxis: Eliquis Code Status: Full code Family Communication: None Disposition Plan: Possible discharge in 1-2 days  barriers for discharge:  Developed developed hypotension   Consultants:   card  Procedures:    Antimicrobials:  Subjective:  Patient states that his abdominal cramps have resolved.  Currently no abdominal pain, chest pain or shortness of breath.  No fever or chills.   Objective: Vitals:   05/10/19 1430 05/10/19 1500 05/10/19 1530 05/10/19 1600  BP: (!) 118/99 (!) 71/61 (!) 79/40 (!) 128/113  Pulse: 62   (!) 118  Resp: (!) 21 18 19  (!) 23  Temp:    98 F (36.7 C)  TempSrc:    Oral  SpO2:  94%   94%  Weight:      Height:        Intake/Output Summary (Last 24 hours) at 05/10/2019 1722 Last data filed at 05/10/2019 1614 Gross per 24 hour  Intake 2348.04 ml  Output 500 ml  Net 1848.04 ml   Filed Weights   05/09/19 0554 05/09/19 1728 05/10/19 0422  Weight: (!) 216.6 kg (!) 207.9 kg (!) 208.7 kg    Examination: Physical Exam:  General: Not in acute distress HEENT: PERRL, EOMI, no scleral icterus, No JVD or bruit Cardiac: S1/S2, RRR, No murmurs, gallops or rubs Pulm: Clear to auscultation bilaterally. No rales, wheezing, rhonchi or rubs. Abd: Soft, nondistended, nontender, no rebound pain, no organomegaly, BS present Ext: 1+ leg pitting edema bilaterally. 2+DP/PT pulse bilaterally Musculoskeletal: No joint deformities, erythema, or stiffness, ROM full Skin: No rashes.  Neuro: Alert and oriented X3, cranial nerves II-XII grossly intact. Psych: Patient is not psychotic, no suicidal or hemocidal ideation.    Data Reviewed: I have personally reviewed following labs and imaging studies  CBC: Recent Labs  Lab 05/06/19 1500 05/07/19 0657 05/08/19 0355 05/10/19 0208  WBC 4.7 5.2 5.2 6.5  NEUTROABS 2.9  --   --  4.3  HGB 14.7 15.5 15.6 14.9  HCT 46.0 47.3 48.0 46.4  MCV 91.5 89.8 90.9 92.1  PLT 211 205 206 243   Basic Metabolic Panel: Recent Labs  Lab 05/06/19 1500 05/06/19 1851 05/08/19 0355 05/09/19 2105 05/10/19 0200 05/10/19 0208  NA 138  --  140 134*  --  135  K 3.7  --  3.5 3.6  --  4.6  CL 103  --  101 99  --  100  CO2 24  --  26 20*  --  24  GLUCOSE 111*  --  94 165*  --  113*  BUN 18  --  17 25*  --  28*  CREATININE 1.32*  --  1.40* 2.41*  --  2.39*  CALCIUM 9.0  --  9.4 8.7*  --  8.8*  MG  --  2.0 1.9 2.2 2.2  --   PHOS  --   --   --  4.8*  --   --    GFR: Estimated Creatinine Clearance: 71.1 mL/min (A) (by C-G formula based on SCr of 2.39 mg/dL (H)). Liver Function Tests: Recent Labs  Lab 05/10/19 0208  AST 54*  ALT 99*  ALKPHOS 49   BILITOT 1.5*  PROT 7.1  ALBUMIN 3.7   No results for input(s): LIPASE, AMYLASE in the last 168 hours. No results for input(s): AMMONIA in the last 168 hours. Coagulation Profile: Recent Labs  Lab 05/06/19 1935  INR 1.3*   Cardiac Enzymes: No results for input(s): CKTOTAL, CKMB, CKMBINDEX, TROPONINI in the last 168 hours. BNP (last 3 results) No results for input(s): PROBNP in the last 8760 hours. HbA1C: Recent Labs    05/10/19 0200  HGBA1C 5.3   CBG: Recent Labs  Lab 05/09/19 2126 05/10/19 0554 05/10/19 13/05/20  05/10/19 1120 05/10/19 1623  GLUCAP 134* 115* 98 93 77   Lipid Profile: No results for input(s): CHOL, HDL, LDLCALC, TRIG, CHOLHDL, LDLDIRECT in the last 72 hours. Thyroid Function Tests: Recent Labs    05/09/19 2317  TSH 1.510  FREET4 1.27*   Anemia Panel: No results for input(s): VITAMINB12, FOLATE, FERRITIN, TIBC, IRON, RETICCTPCT in the last 72 hours. Sepsis Labs: Recent Labs  Lab 05/09/19 2117 05/09/19 2317 05/10/19 0200  PROCALCITON <0.10  --   --   LATICACIDVEN  --  3.0* 2.0*    Recent Results (from the past 240 hour(s))  MRSA PCR Screening     Status: None   Collection Time: 05/06/19  2:19 PM   Specimen: Nasopharyngeal  Result Value Ref Range Status   MRSA by PCR NEGATIVE NEGATIVE Final    Comment:        The GeneXpert MRSA Assay (FDA approved for NASAL specimens only), is one component of a comprehensive MRSA colonization surveillance program. It is not intended to diagnose MRSA infection nor to guide or monitor treatment for MRSA infections. Performed at [facility], [address] [place], [place] [address]   SARS Coronavirus 2 by RT PCR (hospital order, performed in [facility] hospital lab) Nasopharyngeal Nasopharyngeal Swab     Status: None   Collection Time: 05/06/19  4:24 PM   Specimen: Nasopharyngeal Swab  Result Value Ref Range Status   SARS Coronavirus 2 NEGATIVE NEGATIVE Final    Comment: (NOTE) If  result is NEGATIVE SARS-CoV-2 target nucleic acids are NOT DETECTED. The SARS-CoV-2 RNA is generally detectable in upper and lower  respiratory specimens during the acute phase of infection. The lowest  concentration of SARS-CoV-2 viral copies this assay can detect is 250  copies / mL. A negative result does not preclude SARS-CoV-2 infection  and should not be used as the sole basis for treatment or other  patient management decisions.  A negative result may occur with  improper specimen collection / handling, submission of specimen other  than nasopharyngeal swab, presence of viral mutation(s) within the  areas targeted by this assay, and inadequate number of viral copies  (<250 copies / mL). A negative result must be combined with clinical  observations, patient history, and epidemiological information. If result is POSITIVE SARS-CoV-2 target nucleic acids are DETECTED. The SARS-CoV-2 RNA is generally detectable in upper and lower  respiratory specimens dur ing the acute phase of infection.  Positive  results are indicative of active infection with SARS-CoV-2.  Clinical  correlation with patient history and other diagnostic information is  necessary to determine patient infection status.  Positive results do  not rule out bacterial infection or co-infection with other viruses. If result is PRESUMPTIVE POSTIVE SARS-CoV-2 nucleic acids MAY BE PRESENT.   A presumptive positive result was obtained on the submitted specimen  and confirmed on repeat testing.  While 2019 novel coronavirus  (SARS-CoV-2) nucleic acids may be present in the submitted sample  additional confirmatory testing may be necessary for epidemiological  and / or clinical management purposes  to differentiate between  SARS-CoV-2 and other Sarbecovirus currently known to infect humans.  If clinically indicated additional testing with an alternate test  methodology [phone]) is advised. The SARS-CoV-2 RNA is generally    detectable in upper and lower respiratory sp ecimens during the acute  phase of infection. The expected result is Negative. Fact Sheet for Patients:  BoilerBrush.com.cy Fact Sheet for Healthcare Providers: https://pope.com/ This test is not yet  approved or cleared by the [place] and has been authorized for detection and/or diagnosis of SARS-CoV-2 by FDA under an Emergency Use Authorization (EUA).  This EUA will remain in effect (meaning this test can be used) for the duration of the COVID-19 declaration under Section 564(b)(1) of the Act, 21 U.S.C. section 360bbb-3(b)(1), unless the authorization is terminated or revoked sooner. Performed at [facility], [address] [place], [place] [address]       Radiology Studies: Dg Chest 1 View  Result Date: 05/09/2019 CLINICAL DATA:  Shortness of breath EXAM: CHEST  1 VIEW COMPARISON:  05/06/2019 FINDINGS: Cardiomegaly with vascular congestion and interstitial prominence, likely interstitial edema. Findings are similar to prior study. No effusions. No acute bony abnormality. IMPRESSION: Cardiomegaly with mild interstitial edema/CHF. Electronically Signed   By: Rolm Baptise M.D.   On: 05/09/2019 20:36   Dg Abd 1 View  Result Date: 05/09/2019 CLINICAL DATA:  Abdominal distention EXAM: ABDOMEN - 1 VIEW COMPARISON:  None. FINDINGS: Nonobstructive bowel gas pattern.  No organomegaly or free air. IMPRESSION: No acute findings. Electronically Signed   By: Rolm Baptise M.D.   On: 05/09/2019 20:35   US Venous Img Lower Bilateral (dvt)  Result Date: 05/10/2019 CLINICAL DATA:  Bilateral lower extremity edema. EXAM: BILATERAL LOWER EXTREMITY VENOUS DOPPLER ULTRASOUND TECHNIQUE: Gray-scale sonography with graded compression, as well as color Doppler and duplex ultrasound were performed to evaluate the lower extremity deep venous systems from the level of the common femoral vein and  including the common femoral, femoral, profunda femoral, popliteal and calf veins including the posterior tibial, peroneal and gastrocnemius veins when visible. The superficial great saphenous vein was also interrogated. Spectral Doppler was utilized to evaluate flow at rest and with distal augmentation maneuvers in the common femoral, femoral and popliteal veins. COMPARISON:  None. FINDINGS: RIGHT LOWER EXTREMITY Common Femoral Vein: No evidence of thrombus. Normal compressibility, respiratory phasicity and response to augmentation. Saphenofemoral Junction: No evidence of thrombus. Normal compressibility and flow on color Doppler imaging. Profunda Femoral Vein: No evidence of thrombus. Normal compressibility and flow on color Doppler imaging. Femoral Vein: No evidence of thrombus. Normal compressibility, respiratory phasicity and response to augmentation. Popliteal Vein: No evidence of thrombus. Normal compressibility, respiratory phasicity and response to augmentation. Calf Veins: No evidence of thrombus. Normal compressibility and flow on color Doppler imaging. Superficial Great Saphenous Vein: No evidence of thrombus. Normal compressibility. Venous Reflux:  None. Other Findings: No evidence of superficial thrombophlebitis or abnormal fluid collection. LEFT LOWER EXTREMITY Common Femoral Vein: No evidence of thrombus. Normal compressibility, respiratory phasicity and response to augmentation. Saphenofemoral Junction: No evidence of thrombus. Normal compressibility and flow on color Doppler imaging. Profunda Femoral Vein: No evidence of thrombus. Normal compressibility and flow on color Doppler imaging. Femoral Vein: No evidence of thrombus. Normal compressibility, respiratory phasicity and response to augmentation. Popliteal Vein: No evidence of thrombus. Normal compressibility, respiratory phasicity and response to augmentation. Calf Veins: No evidence of thrombus. Normal compressibility and flow on color  Doppler imaging. Superficial Great Saphenous Vein: No evidence of thrombus. Normal compressibility. Venous Reflux:  None. Other Findings: No evidence of superficial thrombophlebitis or abnormal fluid collection. IMPRESSION: No evidence of deep venous thrombosis in either lower extremity. Electronically Signed   By: Aletta Edouard M.D.   On: 05/10/2019 16:17   Dg Chest Port 1 View  Result Date: 05/10/2019 CLINICAL DATA:  AFib with rapid ventricular rate. CHF and pulmonary edema. EXAM: PORTABLE CHEST 1 VIEW COMPARISON:  05/09/2019 FINDINGS: Unchanged cardiac enlargement. Mild diffuse pulmonary edema is similar to previous exam. No pleural effusion identified. IMPRESSION: 1. Cardiac enlargement and pulmonary edema compatible with CHF. Similar to previous exam. Electronically Signed   By: Signa Kell M.D.   On: 05/10/2019 10:49        Scheduled Meds:  alum & mag hydroxide-simeth  30 mL Oral Once   And   lidocaine  15 mL Oral Once   apixaban  5 mg Oral BID   Chlorhexidine Gluconate Cloth  6 each Topical Daily   pantoprazole (PROTONIX) IV  40 mg Intravenous Q24H   senna-docusate  1 tablet Oral BID   sodium chloride flush  3 mL Intravenous Q12H   Continuous Infusions:  sodium chloride 250 mL (05/10/19 1145)   phenylephrine (NEO-SYNEPHRINE) Adult infusion 32 mcg/min (05/10/19 1639)     LOS: 4 days    Time spent: 30 min    Lorretta Harp, DO [facility] PAGER is on AMION  If 7PM-7AM, please contact night-coverage [email or web address] Password [email or web address] 05/10/2019, 5:22 PM

## 2019-05-11 DIAGNOSIS — J9601 Acute respiratory failure with hypoxia: Secondary | ICD-10-CM | POA: Diagnosis present

## 2019-05-11 DIAGNOSIS — J9620 Acute and chronic respiratory failure, unspecified whether with hypoxia or hypercapnia: Secondary | ICD-10-CM

## 2019-05-11 DIAGNOSIS — I4892 Unspecified atrial flutter: Secondary | ICD-10-CM | POA: Insufficient documentation

## 2019-05-11 LAB — GLUCOSE, CAPILLARY
Glucose-Capillary: 104 mg/dL — ABNORMAL HIGH (ref 70–99)
Glucose-Capillary: 130 mg/dL — ABNORMAL HIGH (ref 70–99)
Glucose-Capillary: 80 mg/dL (ref 70–99)
Glucose-Capillary: 84 mg/dL (ref 70–99)
Glucose-Capillary: 86 mg/dL (ref 70–99)
Glucose-Capillary: 89 mg/dL (ref 70–99)
Glucose-Capillary: 92 mg/dL (ref 70–99)

## 2019-05-11 LAB — COMPREHENSIVE METABOLIC PANEL
ALT: 85 U/L — ABNORMAL HIGH (ref 0–44)
AST: 39 U/L (ref 15–41)
Albumin: 3.4 g/dL — ABNORMAL LOW (ref 3.5–5.0)
Alkaline Phosphatase: 53 U/L (ref 38–126)
Anion gap: 9 (ref 5–15)
BUN: 25 mg/dL — ABNORMAL HIGH (ref 6–20)
CO2: 26 mmol/L (ref 22–32)
Calcium: 8.5 mg/dL — ABNORMAL LOW (ref 8.9–10.3)
Chloride: 101 mmol/L (ref 98–111)
Creatinine, Ser: 1.75 mg/dL — ABNORMAL HIGH (ref 0.61–1.24)
GFR calc Af Amer: 53 mL/min — ABNORMAL LOW (ref >60)
GFR calc non Af Amer: 46 mL/min — ABNORMAL LOW (ref >60)
Glucose, Bld: 103 mg/dL — ABNORMAL HIGH (ref 70–99)
Potassium: 3.7 mmol/L (ref 3.5–5.1)
Sodium: 136 mmol/L (ref 135–145)
Total Bilirubin: 1.5 mg/dL — ABNORMAL HIGH (ref 0.3–1.2)
Total Protein: 6.7 g/dL (ref 6.5–8.1)

## 2019-05-11 LAB — CBC WITH DIFFERENTIAL/PLATELET
Abs Immature Granulocytes: 0.01 10*3/uL (ref 0.00–0.07)
Basophils Absolute: 0 10*3/uL (ref 0.0–0.1)
Basophils Relative: 0 %
Eosinophils Absolute: 0.2 10*3/uL (ref 0.0–0.5)
Eosinophils Relative: 4 %
HCT: 45.1 % (ref 39.0–52.0)
Hemoglobin: 14.2 g/dL (ref 13.0–17.0)
Immature Granulocytes: 0 %
Lymphocytes Relative: 32 %
Lymphs Abs: 1.7 10*3/uL (ref 0.7–4.0)
MCH: 29.4 pg (ref 26.0–34.0)
MCHC: 31.5 g/dL (ref 30.0–36.0)
MCV: 93.4 fL (ref 80.0–100.0)
Monocytes Absolute: 0.6 10*3/uL (ref 0.1–1.0)
Monocytes Relative: 10 %
Neutro Abs: 2.9 10*3/uL (ref 1.7–7.7)
Neutrophils Relative %: 54 %
Platelets: 188 10*3/uL (ref 150–400)
RBC: 4.83 MIL/uL (ref 4.22–5.81)
RDW: 12 % (ref 11.5–15.5)
WBC: 5.4 10*3/uL (ref 4.0–10.5)
nRBC: 0 % (ref 0.0–0.2)

## 2019-05-11 LAB — MAGNESIUM: Magnesium: 2 mg/dL (ref 1.7–2.4)

## 2019-05-11 LAB — PROCALCITONIN: Procalcitonin: <0.1 ng/mL

## 2019-05-11 MED ORDER — POTASSIUM CHLORIDE CRYS ER 20 MEQ PO TBCR
20.0000 meq | EXTENDED_RELEASE_TABLET | Freq: Once | ORAL | Status: AC
  Administered 2019-05-11: 20 meq via ORAL
  Filled 2019-05-11: qty 1

## 2019-05-11 MED ORDER — METOPROLOL TARTRATE 5 MG/5ML IV SOLN: 2.5000 mg | INTRAVENOUS | Status: DC | PRN

## 2019-05-11 MED ORDER — AMIODARONE HCL 200 MG PO TABS
200.0000 mg | ORAL_TABLET | Freq: Once | ORAL | Status: AC
  Administered 2019-05-11: 200 mg via ORAL
  Filled 2019-05-11: qty 1

## 2019-05-11 MED ORDER — FUROSEMIDE 10 MG/ML IJ SOLN
40.0000 mg | Freq: Two times a day (BID) | INTRAMUSCULAR | Status: AC
  Administered 2019-05-11 – 2019-05-13 (×4): 40 mg via INTRAVENOUS
  Filled 2019-05-11 (×4): qty 4

## 2019-05-11 MED ORDER — METOPROLOL TARTRATE 25 MG PO TABS: 25.0000 mg | ORAL_TABLET | Freq: Two times a day (BID) | ORAL | Status: DC

## 2019-05-11 MED ORDER — CHLORHEXIDINE GLUCONATE CLOTH 2 % EX PADS
6.0000 | MEDICATED_PAD | Freq: Every day | CUTANEOUS | Status: DC
  Administered 2019-05-11 – 2019-05-14 (×4): 6 via TOPICAL

## 2019-05-11 MED ORDER — METOPROLOL TARTRATE 5 MG/5ML IV SOLN
2.5000 mg | Freq: Once | INTRAVENOUS | Status: AC
  Administered 2019-05-11: 22:00:00 2.5 mg via INTRAVENOUS
  Filled 2019-05-11: qty 5

## 2019-05-11 MED ORDER — POLYETHYLENE GLYCOL 3350 17 G PO PACK
17.0000 g | PACK | Freq: Every day | ORAL | Status: DC
  Administered 2019-05-11 – 2019-05-13 (×3): 17 g via ORAL
  Filled 2019-05-11 (×3): qty 1

## 2019-05-11 MED ORDER — AMIODARONE HCL 200 MG PO TABS
200.0000 mg | ORAL_TABLET | Freq: Two times a day (BID) | ORAL | Status: DC
  Administered 2019-05-11 – 2019-05-16 (×10): 200 mg via ORAL
  Filled 2019-05-11 (×10): qty 1

## 2019-05-11 NOTE — Progress Notes (Signed)
CRITICAL CARE PROGRESS NOTE    Name: Joshua Sampson MRN: [id number] DOB: [birth year]     LOS: 5   SUBJECTIVE FINDINGS & SIGNIFICANT EVENTS   Patient description:   43 yo male admitted to the stepdown unit with new onset atrial fibrillation with rvr initially requiring cardizem gtt and acute hypoxic respiratory failure secondary to suspected undiagnosed OSA/OHS/vascular congestion/possible pulmonary embolism. Pt transitioned to po antiarrhythmic medication and cardizem gtt discontinued, pt transferred to the telemetry unit 11/3.  He required transfer back to the stepdown unit and subsequently changed to ICU status 11/4 with hypotension likely secondary to antihypertensive and antiarrhythmic medications requiring neo-synephrine gtt and worsening acute hypoxic respiratory failure secondary to vascular congestion  requiring Bipap   SIGNIFICANT EVENTS/STUDIES:  11/1: Pt admitted to the stepdown unit with new onset atrial fibrillation with rvr requiring cardizem gtt  11/2: Echo revealed EF 45 to 50%, borderline left ventricular hypertrophy, right ventricular size normal, no increase in right ventricular wall thickness, and global RV systolic function has normal systolic function. 11/3: Pt transitioned off cardizem gtt and started on oral cardizem.  Oral eliquis started for preparation of likely cardioversion in the outpatient setting per Cardiology 11/3: Pts heart rate remained stable, therefore transferred to the telemetry unit  11/4: Pt developed indigestion then 10/10 chest tightness, dyspnea, and diaphoretic (O2 applied/nitro given/EKG obtained revealing atrial fibrillation hr 89-low 100's).  Post treatment pt became hypotensive, therefore rapid response initiated and pt transferred to the stepdown unit.  Upon arrival to the  stepdown unit pt remained hypotensive and diaphoretic PCCM consulted 11/4.   11/4: Pt remained hypotensive requiring levophed gtt and repeat lab workup per PCCM team.  Pt continued to have dyspnea with exertion. CXR concerning for vascular congestion, however due to pressor requirement unable to diurese.  Pt c/o constipation and epigastric pain KUB negative 11/6 - patient improved, had episode of AFRVR today but asymptomatic on 2Lnc  Lines / Drains: PIV  Cultures / Sepsis markers: Pro calcitonin less than 0.10 Blood cultures x2   Antibiotics: None   Protocols / Consultants: Cardiology, critical care   Overnight: No overnight events   PAST MEDICAL HISTORY   Past Medical History:  Diagnosis Date   Hypertension    Obesity      SURGICAL HISTORY   Past Surgical History:  Procedure Laterality Date   CHOLECYSTECTOMY       FAMILY HISTORY   Family History  Problem Relation Age of Onset   Hypertension Mother    Hypertension Father    Stroke Brother      SOCIAL HISTORY   Social History   Tobacco Use   Smoking status: Never Smoker   Smokeless tobacco: Never Used  Substance Use Topics   Alcohol use: Not Currently   Drug use: Never     MEDICATIONS   Current Medication:  Current Facility-Administered Medications:    0.9 %  sodium chloride infusion, 250 mL, Intravenous, Continuous, Luise Yamamoto, MD, Last Rate: 20 mL/hr at 05/10/19 1145, 250 mL at 05/10/19 1145   acetaminophen (TYLENOL) tablet 650 mg, 650 mg, Oral, Q6H PRN **OR** acetaminophen (TYLENOL) suppository 650 mg, 650 mg, Rectal, Q6H PRN, Lurene Shadow, MD   alum & mag hydroxide-simeth (MAALOX/MYLANTA) 200-200-20 MG/5ML suspension 30 mL, 30 mL, Oral, Q4H PRN, Lorretta Harp, MD, 30 mL at 05/09/19 1659   alum & mag hydroxide-simeth (MAALOX/MYLANTA) 200-200-20 MG/5ML suspension 30 mL, 30 mL, Oral, Once, Stopped at 05/09/19 2256 **AND** lidocaine (XYLOCAINE) 2 % viscous mouth solution 15  mL,  15 mL, Oral, Once, Eugenie Norrie, NP, Stopped at 05/09/19 2256   amiodarone (PACERONE) tablet 200 mg, 200 mg, Oral, BID, Blakeney, Neldon Newport, NP, 200 mg at 05/11/19 1358   apixaban (ELIQUIS) tablet 5 mg, 5 mg, Oral, BID, Blakeney, Neldon Newport, NP, 5 mg at 05/11/19 1358   Chlorhexidine Gluconate Cloth 2 % PADS 6 each, 6 each, Topical, Daily, Karna Christmas, Mahreen Schewe, MD   levalbuterol (XOPENEX) nebulizer solution 0.63 mg, 0.63 mg, Nebulization, Q6H PRN, Eugenie Norrie, NP   MEDLINE mouth rinse, 15 mL, Mouth Rinse, BID, Blakeney, Neldon Newport, NP   morphine 2 MG/ML injection 2 mg, 2 mg, Intravenous, Q4H PRN, Jimmye Norman, NP, 2 mg at 05/09/19 2011   ondansetron (ZOFRAN) tablet 4 mg, 4 mg, Oral, Q6H PRN **OR** ondansetron (ZOFRAN) injection 4 mg, 4 mg, Intravenous, Q6H PRN, Lurene Shadow, MD, 4 mg at 05/09/19 1835   pantoprazole (PROTONIX) injection 40 mg, 40 mg, Intravenous, Q24H, Bertram Savin, RPH, 40 mg at 05/10/19 2136   phenylephrine (NEO-SYNEPHRINE) 10 mg in sodium chloride 0.9 % 250 mL (0.04 mg/mL) infusion, 25-200 mcg/min, Intravenous, Titrated, Katlynn Naser, MD, Last Rate: 15 mL/hr at 05/11/19 0455, 10 mcg/min at 05/11/19 0455   polyethylene glycol (MIRALAX / GLYCOLAX) packet 17 g, 17 g, Oral, Daily PRN, Lurene Shadow, MD, 17 g at 05/09/19 2035   polyethylene glycol (MIRALAX / GLYCOLAX) packet 17 g, 17 g, Oral, Daily, Bertram Savin, RPH, 17 g at 05/11/19 1122   senna-docusate (Senokot-S) tablet 1 tablet, 1 tablet, Oral, BID, Lorretta Harp, MD, 1 tablet at 05/11/19 1113   sodium chloride flush (NS) 0.9 % injection 3 mL, 3 mL, Intravenous, Q12H, Lurene Shadow, MD, 3 mL at 05/11/19 1122    ALLERGIES   Patient has no known allergies.    REVIEW OF SYSTEMS     10 point review of systems conducted is negative except as per subjective findings PHYSICAL EXAMINATION   Vital Signs: Temp:  [97.6 F (36.4 C)-98.2 F (36.8 C)] 98.2 F (36.8 C) (11/06 1200) Pulse Rate:   [27-126] 71 (11/06 1400) Resp:  [17-27] 20 (11/06 1400) BP: (71-145)/(40-131) 135/111 (11/06 1400) SpO2:  [91 %-100 %] 100 % (11/06 1400) Weight:  [215.4 kg] 215.4 kg (11/06 0337)  GENERAL: Obese age-appropriate HEAD: Normocephalic, atraumatic.  EYES: Pupils equal, round, reactive to light.  No scleral icterus.  MOUTH: Moist mucosal membrane. NECK: Supple. No thyromegaly. No nodules. No JVD.  PULMONARY: Clear to auscultation bilaterally CARDIOVASCULAR: S1 and S2. IRRegular rate and rhythm. No murmurs, rubs, or gallops.  GASTROINTESTINAL: Soft, nontender, non-distended. No masses. Positive bowel sounds. No hepatosplenomegaly.  MUSCULOSKELETAL: No swelling, clubbing, or edema.  NEUROLOGIC: Mild distress due to acute illness SKIN:intact,warm,dry   PERTINENT DATA     Infusions:  sodium chloride 250 mL (05/10/19 1145)   phenylephrine (NEO-SYNEPHRINE) Adult infusion 10 mcg/min (05/11/19 0455)   Scheduled Medications:  alum & mag hydroxide-simeth  30 mL Oral Once   And   lidocaine  15 mL Oral Once   amiodarone  200 mg Oral BID   apixaban  5 mg Oral BID   Chlorhexidine Gluconate Cloth  6 each Topical Daily   mouth rinse  15 mL Mouth Rinse BID   pantoprazole (PROTONIX) IV  40 mg Intravenous Q24H   polyethylene glycol  17 g Oral Daily   senna-docusate  1 tablet Oral BID   sodium chloride flush  3 mL Intravenous Q12H   PRN Medications: acetaminophen **OR** acetaminophen, alum &  mag hydroxide-simeth, levalbuterol, morphine injection, ondansetron **OR** ondansetron (ZOFRAN) IV, polyethylene glycol Hemodynamic parameters:   Intake/Output: 11/05 0701 - 11/06 0700 In: 2330.2 [I.V.:1314.9; IV Piggyback:1015.3] Out: 1900 [Urine:1900]  Ventilator  Settings:      LAB RESULTS:  Basic Metabolic Panel: Recent Labs  Lab 05/06/19 1851 05/08/19 0355 05/09/19 2105 05/10/19 0200 05/10/19 0208 05/10/19 1738 05/11/19 0429  NA  --  140 134*  --  135 137 136  K  --  3.5  3.6  --  4.6 4.3 3.7  CL  --  101 99  --  100 101 101  CO2  --  26 20*  --  24 24 26   GLUCOSE  --  94 165*  --  113* 100* 103*  BUN  --  17 25*  --  28* 30* 25*  CREATININE  --  1.40* 2.41*  --  2.39* 2.10* 1.75*  CALCIUM  --  9.4 8.7*  --  8.8* 8.9 8.5*  MG 2.0 1.9 2.2 2.2  --   --  2.0  PHOS  --   --  4.8*  --   --   --   --    Liver Function Tests: Recent Labs  Lab 05/10/19 0208 05/10/19 1738 05/11/19 0429  AST 54* 38 39  ALT 99* 88* 85*  ALKPHOS 49 51 53  BILITOT 1.5* 1.4* 1.5*  PROT 7.1 6.9 6.7  ALBUMIN 3.7 3.4* 3.4*   No results for input(s): LIPASE, AMYLASE in the last 168 hours. No results for input(s): AMMONIA in the last 168 hours. CBC: Recent Labs  Lab 05/06/19 1500 05/07/19 0657 05/08/19 0355 05/10/19 0208 05/11/19 0429  WBC 4.7 5.2 5.2 6.5 5.4  NEUTROABS 2.9  --   --  4.3 2.9  HGB 14.7 15.5 15.6 14.9 14.2  HCT 46.0 47.3 48.0 46.4 45.1  MCV 91.5 89.8 90.9 92.1 93.4  PLT 211 205 206 243 188   Cardiac Enzymes: No results for input(s): CKTOTAL, CKMB, CKMBINDEX, TROPONINI in the last 168 hours. BNP: Invalid input(s): POCBNP CBG: Recent Labs  Lab 05/10/19 1905 05/10/19 2312 05/11/19 0330 05/11/19 0724 05/11/19 1148  GLUCAP 105* 99 84 80 130*     IMAGING RESULTS:  Imaging: Dg Chest 1 View  Result Date: 05/09/2019 CLINICAL DATA:  Shortness of breath EXAM: CHEST  1 VIEW COMPARISON:  05/06/2019 FINDINGS: Cardiomegaly with vascular congestion and interstitial prominence, likely interstitial edema. Findings are similar to prior study. No effusions. No acute bony abnormality. IMPRESSION: Cardiomegaly with mild interstitial edema/CHF. Electronically Signed   By: 13/07/2018 M.D.   On: 05/09/2019 20:36   Dg Abd 1 View  Result Date: 05/09/2019 CLINICAL DATA:  Abdominal distention EXAM: ABDOMEN - 1 VIEW COMPARISON:  None. FINDINGS: Nonobstructive bowel gas pattern.  No organomegaly or free air. IMPRESSION: No acute findings. Electronically Signed   By:  13/10/2018 M.D.   On: 05/09/2019 20:35   13/10/2018 Venous Img Lower Bilateral (dvt)  Result Date: 05/10/2019 CLINICAL DATA:  Bilateral lower extremity edema. EXAM: BILATERAL LOWER EXTREMITY VENOUS DOPPLER ULTRASOUND TECHNIQUE: Gray-scale sonography with graded compression, as well as color Doppler and duplex ultrasound were performed to evaluate the lower extremity deep venous systems from the level of the common femoral vein and including the common femoral, femoral, profunda femoral, popliteal and calf veins including the posterior tibial, peroneal and gastrocnemius veins when visible. The superficial great saphenous vein was also interrogated. Spectral Doppler was utilized to evaluate flow at rest and with distal  augmentation maneuvers in the common femoral, femoral and popliteal veins. COMPARISON:  None. FINDINGS: RIGHT LOWER EXTREMITY Common Femoral Vein: No evidence of thrombus. Normal compressibility, respiratory phasicity and response to augmentation. Saphenofemoral Junction: No evidence of thrombus. Normal compressibility and flow on color Doppler imaging. Profunda Femoral Vein: No evidence of thrombus. Normal compressibility and flow on color Doppler imaging. Femoral Vein: No evidence of thrombus. Normal compressibility, respiratory phasicity and response to augmentation. Popliteal Vein: No evidence of thrombus. Normal compressibility, respiratory phasicity and response to augmentation. Calf Veins: No evidence of thrombus. Normal compressibility and flow on color Doppler imaging. Superficial Great Saphenous Vein: No evidence of thrombus. Normal compressibility. Venous Reflux:  None. Other Findings: No evidence of superficial thrombophlebitis or abnormal fluid collection. LEFT LOWER EXTREMITY Common Femoral Vein: No evidence of thrombus. Normal compressibility, respiratory phasicity and response to augmentation. Saphenofemoral Junction: No evidence of thrombus. Normal compressibility and flow on color  Doppler imaging. Profunda Femoral Vein: No evidence of thrombus. Normal compressibility and flow on color Doppler imaging. Femoral Vein: No evidence of thrombus. Normal compressibility, respiratory phasicity and response to augmentation. Popliteal Vein: No evidence of thrombus. Normal compressibility, respiratory phasicity and response to augmentation. Calf Veins: No evidence of thrombus. Normal compressibility and flow on color Doppler imaging. Superficial Great Saphenous Vein: No evidence of thrombus. Normal compressibility. Venous Reflux:  None. Other Findings: No evidence of superficial thrombophlebitis or abnormal fluid collection. IMPRESSION: No evidence of deep venous thrombosis in either lower extremity. Electronically Signed   By: Irish LackGlenn  Yamagata M.D.   On: 05/10/2019 16:17   Dg Chest Port 1 View  Result Date: 05/10/2019 CLINICAL DATA:  Acute on chronic respiratory failure EXAM: PORTABLE CHEST 1 VIEW COMPARISON:  05/10/2019 FINDINGS: Cardiomegaly with vascular congestion and mild interstitial edema. Findings are similar prior study. No effusions. No acute bony abnormality. IMPRESSION: Mild CHF, similar to prior study. Electronically Signed   By: Charlett NoseKevin  Dover M.D.   On: 05/10/2019 21:43   Dg Chest Port 1 View  Result Date: 05/10/2019 CLINICAL DATA:  AFib with rapid ventricular rate. CHF and pulmonary edema. EXAM: PORTABLE CHEST 1 VIEW COMPARISON:  05/09/2019 FINDINGS: Unchanged cardiac enlargement. Mild diffuse pulmonary edema is similar to previous exam. No pleural effusion identified. IMPRESSION: 1. Cardiac enlargement and pulmonary edema compatible with CHF. Similar to previous exam. Electronically Signed   By: Signa Kellaylor  Stroud M.D.   On: 05/10/2019 10:49      ASSESSMENT AND PLAN    -Multidisciplinary rounds held today  Acute Hypoxic Respiratory Failure -due to acute CHF exacerbation with reduced EF  -continue supplemental O2 -Diuresis with 40 lasix BID x 2 days -strict I&O -fluid  restriction 123400ml/24h -CXR with interstitial edema -cardiology on case - appreciate input  Atrial fibrillation with RVR - on amio 200 bid  -holding BB and CCB due to hypotension on arrival to MICU -oxygen as needed ICU monitoring  Renal Failure-acute onchronic stage 3 -follow chem 7 -follow UO -continue Foley Catheter-assess need daily  Morbid obesity with probable OSA - outpatient PSG  - weight loss    Elevated troponin    -mild and likely clinically insignificant in setting of renal impairment   GERD    Likely cause of epigastric discomofort post prandially  ID -continue IV abx as prescibed -follow up cultures  GI/Nutrition GI PROPHYLAXIS as indicated DIET-->TF's as tolerated Constipation protocol as indicated  ENDO - ICU hypoglycemic\Hyperglycemia protocol -check FSBS per protocol   ELECTROLYTES -follow labs as needed -replace as needed -pharmacy consultation  DVT/GI PRX ordered -SCDs  TRANSFUSIONS AS NEEDED MONITOR FSBS ASSESS the need for LABS as needed   Critical care provider statement:    Critical care time (minutes):  32   Critical care time was exclusive of:  Separately billable procedures and treating other patients   Critical care was necessary to treat or prevent imminent or life-threatening deterioration of the following conditions:   Acute hypoxemic respiratory failure, new onset CHF with acute exacerbation, atrial fibrillation with rapid ventricular response, morbid obesity, multiple comorbid conditions   Critical care was time spent personally by me on the following activities:  Development of treatment plan with patient or surrogate, discussions with consultants, evaluation of patient's response to treatment, examination of patient, obtaining history from patient or surrogate, ordering and performing treatments and interventions, ordering and review of laboratory studies and re-evaluation of patient's condition.  I assumed direction of  critical care for this patient from another provider in my specialty: no    This document was prepared using Dragon voice recognition software and may include unintentional dictation errors.    Ottie Glazier, M.D.  Division of [facility]

## 2019-05-11 NOTE — Progress Notes (Signed)
Pharmacy Electrolyte Monitoring Consult:  Pharmacy consulted to assist in monitoring and replacing electrolytes in this 43 y.o. male admitted on 05/06/2019 with Headache, Tachycardia, and Shortness of Breath   Labs:  Sodium (mmol/L)  Date Value  05/11/2019 136   Potassium (mmol/L)  Date Value  05/11/2019 3.7   Magnesium (mg/dL)  Date Value  05/11/2019 2.0   Phosphorus (mg/dL)  Date Value  05/09/2019 4.8 (H)   Calcium (mg/dL)  Date Value  05/11/2019 8.5 (L)   Albumin (g/dL)  Date Value  05/11/2019 3.4 (L)    Assessment/Plan: Potassium 74mEq x 1.   Will obtain BMP/Magnesium with am labs.   Will replace for goal potassium ~ 4 and goal magnesium ~ 2.   Pharmacy will continue to monitor and adjust per consult.   Rashaun Wichert L 05/11/2019 5:35 PM

## 2019-05-11 NOTE — Progress Notes (Signed)
[facility]  SUBJECTIVE: The patient denies recurrent chest pain or abdominal pain since yesterday. He denies palpitations or heart racing. He reports feeling much better today.    Vitals:   05/11/19 0430 05/11/19 0500 05/11/19 0530 05/11/19 0600  BP: 128/88 (!) 131/112 123/88 116/83  Pulse: (!) 115 (!) 39 (!) 40 65  Resp: 18     Temp: 98.2 F (36.8 C)     TempSrc: Oral     SpO2: 100% 100% 98% 97%  Weight:      Height:         Intake/Output Summary (Last 24 hours) at 05/11/2019 0820 Last data filed at 05/11/2019 0600 Gross per 24 hour  Intake 2330.22 ml  Output 1900 ml  Net 430.22 ml      PHYSICAL EXAM  General: morbidly obese gentleman sitting up in bed in no acute distress HEENT:  Normocephalic and atramatic Neck:  No JVD.  Lungs: normal effort of breathing on supplemental oxygen via nasal cannula, distant breath sounds throughout. No wheezing or crackles Heart: irregularly irregular, no gallops or murmurs.  Abdomen: nondistended Msk:  Back normal, gait not assessed. Normal strength and tone for age. Extremities: 1+edema bilaterally Neuro: Alert and oriented X 3. Psych:  Good affect, responds appropriately   LABS: Basic Metabolic Panel: Recent Labs    05/09/19 2105 05/10/19 0200  05/10/19 1738 05/11/19 0429  NA 134*  --    < > 137 136  K 3.6  --    < > 4.3 3.7  CL 99  --    < > 101 101  CO2 20*  --    < > 24 26  GLUCOSE 165*  --    < > 100* 103*  BUN 25*  --    < > 30* 25*  CREATININE 2.41*  --    < > 2.10* 1.75*  CALCIUM 8.7*  --    < > 8.9 8.5*  MG 2.2 2.2  --   --  2.0  PHOS 4.8*  --   --   --   --    < > = values in this interval not displayed.   Liver Function Tests: Recent Labs    05/10/19 1738 05/11/19 0429  AST 38 39  ALT 88* 85*  ALKPHOS 51 53  BILITOT 1.4* 1.5*  PROT 6.9 6.7  ALBUMIN 3.4* 3.4*   No results for input(s): LIPASE, AMYLASE in the last 72 hours. CBC: Recent Labs    05/10/19 0208 05/11/19 0429  WBC 6.5 5.4   NEUTROABS 4.3 2.9  HGB 14.9 14.2  HCT 46.4 45.1  MCV 92.1 93.4  PLT 243 188   Cardiac Enzymes: No results for input(s): CKTOTAL, CKMB, CKMBINDEX, TROPONINI in the last 72 hours. BNP: Invalid input(s): POCBNP D-Dimer: No results for input(s): DDIMER in the last 72 hours. Hemoglobin A1C: Recent Labs    05/10/19 0200  HGBA1C 5.3   Fasting Lipid Panel: No results for input(s): CHOL, HDL, LDLCALC, TRIG, CHOLHDL, LDLDIRECT in the last 72 hours. Thyroid Function Tests: Recent Labs    05/09/19 2317  TSH 1.510   Anemia Panel: No results for input(s): VITAMINB12, FOLATE, FERRITIN, TIBC, IRON, RETICCTPCT in the last 72 hours.  Dg Chest 1 View  Result Date: 05/09/2019 CLINICAL DATA:  Shortness of breath EXAM: CHEST  1 VIEW COMPARISON:  05/06/2019 FINDINGS: Cardiomegaly with vascular congestion and interstitial prominence, likely interstitial edema. Findings are similar to prior study. No effusions. No acute bony abnormality. IMPRESSION: Cardiomegaly  with mild interstitial edema/CHF. Electronically Signed   By: Rolm Baptise M.D.   On: 05/09/2019 20:36   Dg Abd 1 View  Result Date: 05/09/2019 CLINICAL DATA:  Abdominal distention EXAM: ABDOMEN - 1 VIEW COMPARISON:  None. FINDINGS: Nonobstructive bowel gas pattern.  No organomegaly or free air. IMPRESSION: No acute findings. Electronically Signed   By: Rolm Baptise M.D.   On: 05/09/2019 20:35   US Venous Img Lower Bilateral (dvt)  Result Date: 05/10/2019 CLINICAL DATA:  Bilateral lower extremity edema. EXAM: BILATERAL LOWER EXTREMITY VENOUS DOPPLER ULTRASOUND TECHNIQUE: Gray-scale sonography with graded compression, as well as color Doppler and duplex ultrasound were performed to evaluate the lower extremity deep venous systems from the level of the common femoral vein and including the common femoral, femoral, profunda femoral, popliteal and calf veins including the posterior tibial, peroneal and gastrocnemius veins when visible. The  superficial great saphenous vein was also interrogated. Spectral Doppler was utilized to evaluate flow at rest and with distal augmentation maneuvers in the common femoral, femoral and popliteal veins. COMPARISON:  None. FINDINGS: RIGHT LOWER EXTREMITY Common Femoral Vein: No evidence of thrombus. Normal compressibility, respiratory phasicity and response to augmentation. Saphenofemoral Junction: No evidence of thrombus. Normal compressibility and flow on color Doppler imaging. Profunda Femoral Vein: No evidence of thrombus. Normal compressibility and flow on color Doppler imaging. Femoral Vein: No evidence of thrombus. Normal compressibility, respiratory phasicity and response to augmentation. Popliteal Vein: No evidence of thrombus. Normal compressibility, respiratory phasicity and response to augmentation. Calf Veins: No evidence of thrombus. Normal compressibility and flow on color Doppler imaging. Superficial Great Saphenous Vein: No evidence of thrombus. Normal compressibility. Venous Reflux:  None. Other Findings: No evidence of superficial thrombophlebitis or abnormal fluid collection. LEFT LOWER EXTREMITY Common Femoral Vein: No evidence of thrombus. Normal compressibility, respiratory phasicity and response to augmentation. Saphenofemoral Junction: No evidence of thrombus. Normal compressibility and flow on color Doppler imaging. Profunda Femoral Vein: No evidence of thrombus. Normal compressibility and flow on color Doppler imaging. Femoral Vein: No evidence of thrombus. Normal compressibility, respiratory phasicity and response to augmentation. Popliteal Vein: No evidence of thrombus. Normal compressibility, respiratory phasicity and response to augmentation. Calf Veins: No evidence of thrombus. Normal compressibility and flow on color Doppler imaging. Superficial Great Saphenous Vein: No evidence of thrombus. Normal compressibility. Venous Reflux:  None. Other Findings: No evidence of superficial  thrombophlebitis or abnormal fluid collection. IMPRESSION: No evidence of deep venous thrombosis in either lower extremity. Electronically Signed   By: Aletta Edouard M.D.   On: 05/10/2019 16:17   Dg Chest Port 1 View  Result Date: 05/10/2019 CLINICAL DATA:  Acute on chronic respiratory failure EXAM: PORTABLE CHEST 1 VIEW COMPARISON:  05/10/2019 FINDINGS: Cardiomegaly with vascular congestion and mild interstitial edema. Findings are similar prior study. No effusions. No acute bony abnormality. IMPRESSION: Mild CHF, similar to prior study. Electronically Signed   By: Rolm Baptise M.D.   On: 05/10/2019 21:43   Dg Chest Port 1 View  Result Date: 05/10/2019 CLINICAL DATA:  AFib with rapid ventricular rate. CHF and pulmonary edema. EXAM: PORTABLE CHEST 1 VIEW COMPARISON:  05/09/2019 FINDINGS: Unchanged cardiac enlargement. Mild diffuse pulmonary edema is similar to previous exam. No pleural effusion identified. IMPRESSION: 1. Cardiac enlargement and pulmonary edema compatible with CHF. Similar to previous exam. Electronically Signed   By: Kerby Moors M.D.   On: 05/10/2019 10:49     Echo LVEF 45-50%  TELEMETRY: atrial fibrillation, rate 114-120s  ASSESSMENT AND PLAN:  Principal Problem:   Atrial flutter with rapid ventricular response (HCC) Active Problems:   Morbid obesity (HCC)   Hypertension   Headache   CKD (chronic kidney disease), stage II   Chest pain   Elevated troponin   Hypotension    1. New onset atrial flutter with RVR, initially on metoprolol 100 mg BID and diltiazem 240 mg daily, but was discontinued due to hypotension and bradycardia. Started on amiodarone 200 mg BID with first dose this morning. Started on Eliquis 5 mg BID with a chads vasc score of 2. Underlying etiology of onset possibly due to untreated OSA. 2. Hypotension, uncertain etiology, on Neo-Synephrine 3. New onset CHF with mildly reduced LV function, which may be erroneously low if patient was in rapid  atrial flutter at the time of echocardiogram. Chest xray revealed vascular congestion. Patient had been receiving IV Lasix, but was discontinued due to hypotension. Currently on BiPAP. 4. Morbid obesity with BMI 66 5. Chest pain, associated with abdominal discomfort, both of which have resolved. ECG unchanged. Stat troponin in the the setting of chest pain was 28.  6. Elevated troponin, peaked at 62. Likely secondary to demand supply ischemia and AKI  7. Acute kidney injury with creatinine 1.5, likely secondary to hypotension and diuretics  Recommendations: 1. Continue Eliquis 5 mg BID for stroke prevention 2. Continue to hold metoprolol and diltiazem due to hypotension 3. Continue amiodarone 200 mg BID for rhythm control 4. Continue to defer ACE-I or ARB, and diuretic in setting of hypotension 5. Continue Neo and BiPAP as indicated 6. Recommend outpatient sleep study, and weight loss 7. Consider ischemic work-up as outpatient with Dr. Marthe Patch, PA-C 05/11/2019 8:20 AM   Discussed with DR. PARASCHOS who agreed with the above plan.

## 2019-05-11 NOTE — Progress Notes (Signed)
Shift summary:  - Patient care assumed at 1530 hrs from Aldona Lento, RN.

## 2019-05-11 NOTE — Progress Notes (Signed)
PROGRESS NOTE    Joshua Sampson  ZOX:[id number] DOB: February 14, 1976 DOA: 05/06/2019 PCP: System, Pcp Not In    Brief Narrative:   Joshua Sampson is a 43 y.o. male with medical history significant for morbid obesity, hypertension and cluster headaches.  Patient states he is overall been doing well.  Through keto diet, he has lost 10 to 15 pounds lately intentionally.  He says in the last couple days has not been sleeping great, but overall he sleeps okay.  He is unsure if he snores, sleeps about 5 to 6 hours a night and says he does feel well rested.  Started having a cluster headache 3 days ago.  Intermittent, lasting about 10 minutes, but he had several repeatedly for the past few days including today so he came into the urgent care.  Prior to coming in, patient said he felt some mild chest palpitations and a little short of breath, but that quickly resolved.   43 y.o. 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  Interim History:  1. Yesterday 11/5, pt developed hypotension with blood pressure 85/73 mm Hg and pulse rate 42 beats/min. PCCM was consulted.  Vasopressor, Neo was stared. Pt still is on IV Neo. Bp 116/83 2. He denies chest pain and abdominal pain this morning.   Assessment & Plan:   Principal Problem:   Atrial flutter with rapid ventricular response (HCC) Active Problems:   Morbid obesity (HCC)   Hypertension   Headache   CKD (chronic kidney disease), stage II   Chest pain   Elevated troponin   Hypotension  Hypotension: Patient's mental status is normal.  Etiology is not clear.  May be related to multiple hypertensive medications.  No fever or leukocytosis, low suspicion for sepsis.  PCCM was consulted.  Vasopressor, Neo was started.   CXR showed cardiomegaly and vascular congestion  without infiltration. Bp 116/83 in AM  -continue Vasopressor, Neo -f/u PCCM recommendations -Continue BIPAP -Blood cultures no grow so far  New onset atrial flutter with rapid ventricular response [facility]): card was consulted. CHA2DS2-VASc Score is 2, needs oral anticoagulation. Patient is started with Eliquis. ECHO with mildly reduced LV function and EF of 45-50%. - Eliquis 5 mg BID for anticoagulation  - hold  diltiazem 240 mg daily and metoprolol 100 mg bid due to hypotension -on Amiodarone 200 mg bid  New Systolic CHF: ECHO with mildly reduced LV function and EF of 45-50%. No respiratory distress -restarted IV lasix 40 mg bid   Chest pain and troponin elevation:  Patient with mildly elevated HS-troponin to 63 initially, then 62 on repeat. Pt develop chest pain again in later afternoon today.  Stat troponin 28. Had hypotension with SBP in upper 80s which improved to SBP>90s. -will get stat EKG - trend trop - on eliquis,  -Card is on board   Morbid obesity: BMI 63.92 -Diet and exercise.   -Encouraged to lose weight.   Hypertension: now has hypotension -hold Bp meds  CKD (chronic kidney disease), stage II: worsening. baseline Cre 1.2. his Cre is 2.39 -->1.75 and BUN 25. May be due to ATN secondary to hypotension and continuation of diuretics. -hold diuretics -On vasopressor -f/u by BMP  DVT prophylaxis: Eliquis Code Status: Full code Family Communication: None Disposition Plan: Possible discharge in 1-2 days  barriers for discharge:  Developed developed hypotension, still  on IV Vasopressor, Neo    Consultants:   card  Procedures:    Antimicrobials:    Subjective:  Patient states that his abdominal cramps have resolved.  Currently no abdominal pain, chest pain or shortness of breath.  No fever or chills.   Objective: Vitals:   05/11/19 0430 05/11/19 0500 05/11/19 0530 05/11/19 0600  BP: 128/88 (!) 131/112 123/88 116/83  Pulse: (!) 115 (!) 39 (!) 40 65  Resp:  18     Temp: 98.2 F (36.8 C)     TempSrc: Oral     SpO2: 100% 100% 98% 97%  Weight:      Height:        Intake/Output Summary (Last 24 hours) at 05/11/2019 0835 Last data filed at 05/11/2019 0600 Gross per 24 hour  Intake 2330.22 ml  Output 1900 ml  Net 430.22 ml   Filed Weights   05/09/19 1728 05/10/19 0422 05/11/19 0337  Weight: (!) 207.9 kg (!) 208.7 kg (!) 215.4 kg    Examination: Physical Exam:  General: Not in acute distress HEENT: PERRL, EOMI, no scleral icterus, No JVD or bruit Cardiac: S1/S2, RRR, No murmurs, gallops or rubs Pulm: Clear to auscultation bilaterally. No rales, wheezing, rhonchi or rubs. Abd: Soft, nondistended, nontender, no rebound pain, no organomegaly, BS present Ext: 1+ leg pitting edema bilaterally. 2+DP/PT pulse bilaterally Musculoskeletal: No joint deformities, erythema, or stiffness, ROM full Skin: No rashes.  Neuro: Alert and oriented X3, cranial nerves II-XII grossly intact. Psych: Patient is not psychotic, no suicidal or hemocidal ideation.    Data Reviewed: I have personally reviewed following labs and imaging studies  CBC: Recent Labs  Lab 05/06/19 1500 05/07/19 0657 05/08/19 0355 05/10/19 0208 05/11/19 0429  WBC 4.7 5.2 5.2 6.5 5.4  NEUTROABS 2.9  --   --  4.3 2.9  HGB 14.7 15.5 15.6 14.9 14.2  HCT 46.0 47.3 48.0 46.4 45.1  MCV 91.5 89.8 90.9 92.1 93.4  PLT 211 205 206 243 188   Basic Metabolic Panel: Recent Labs  Lab 05/06/19 1851 05/08/19 0355 05/09/19 2105 05/10/19 0200 05/10/19 0208 05/10/19 1738 05/11/19 0429  NA  --  140 134*  --  135 137 136  K  --  3.5 3.6  --  4.6 4.3 3.7  CL  --  101 99  --  100 101 101  CO2  --  26 20*  --  GLUCOSE  --  94 165*  --  113* 100* 103*  BUN  --  17 25*  --  28* 30* 25*  CREATININE  --  1.40* 2.41*  --  2.39* 2.10* 1.75*  CALCIUM  --  9.4 8.7*  --  8.8* 8.9 8.5*  MG 2.0 1.9 2.2 2.2  --   --  2.0  PHOS  --   --  4.8*  --   --   --   --    GFR: Estimated  Creatinine Clearance: 99 mL/min (A) (by C-G formula based on SCr of 1.75 mg/dL (H)). Liver Function Tests: Recent Labs  Lab 05/10/19 0208 05/10/19 1738 05/11/19 0429  AST 54* 38 39  ALT 99* 88* 85*  ALKPHOS 49 51 53  BILITOT 1.5* 1.4* 1.5*  PROT 7.1 6.9 6.7  ALBUMIN 3.7 3.4* 3.4*   No results for input(s): LIPASE, AMYLASE in the last 168 hours. No results for input(s): AMMONIA in the last 168 hours. Coagulation Profile: Recent Labs  Lab 05/06/19 1935  INR 1.3*  Cardiac Enzymes: No results for input(s): CKTOTAL, CKMB, CKMBINDEX, TROPONINI in the last 168 hours. BNP (last 3 results) No results for input(s): PROBNP in the last 8760 hours. HbA1C: Recent Labs    05/10/19 0200  HGBA1C 5.3   CBG: Recent Labs  Lab 05/10/19 1623 05/10/19 1905 05/10/19 2312 05/11/19 0330 05/11/19 0724  GLUCAP 77 105* 99 84 80   Lipid Profile: No results for input(s): CHOL, HDL, LDLCALC, TRIG, CHOLHDL, LDLDIRECT in the last 72 hours. Thyroid Function Tests: Recent Labs    05/09/19 2317  TSH 1.510  FREET4 1.27*   Anemia Panel: No results for input(s): VITAMINB12, FOLATE, FERRITIN, TIBC, IRON, RETICCTPCT in the last 72 hours. Sepsis Labs: Recent Labs  Lab 05/09/19 2117 05/09/19 2317 05/10/19 0200 05/11/19 0429  PROCALCITON <0.10  --   --  <0.10  LATICACIDVEN  --  3.0* 2.0*  --     Recent Results (from the past 240 hour(s))  MRSA PCR Screening     Status: None   Collection Time: 05/06/19  2:19 PM   Specimen: Nasopharyngeal  Result Value Ref Range Status   MRSA by PCR NEGATIVE NEGATIVE Final    Comment:        The GeneXpert MRSA Assay (FDA approved for NASAL specimens only), is one component of a comprehensive MRSA colonization surveillance program. It is not intended to diagnose MRSA infection nor to guide or monitor treatment for MRSA infections. Performed at [facility], [address] [place], [place] [address]   SARS Coronavirus 2 by RT PCR  (hospital order, performed in [facility] hospital lab) Nasopharyngeal Nasopharyngeal Swab     Status: None   Collection Time: 05/06/19  4:24 PM   Specimen: Nasopharyngeal Swab  Result Value Ref Range Status   SARS Coronavirus 2 NEGATIVE NEGATIVE Final    Comment: (NOTE) If result is NEGATIVE SARS-CoV-2 target nucleic acids are NOT DETECTED. The SARS-CoV-2 RNA is generally detectable in upper and lower  respiratory specimens during the acute phase of infection. The lowest  concentration of SARS-CoV-2 viral copies this assay can detect is 250  copies / mL. A negative result does not preclude SARS-CoV-2 infection  and should not be used as the sole basis for treatment or other  patient management decisions.  A negative result may occur with  improper specimen collection / handling, submission of specimen other  than nasopharyngeal swab, presence of viral mutation(s) within the  areas targeted by this assay, and inadequate number of viral copies  (<250 copies / mL). A negative result must be combined with clinical  observations, patient history, and epidemiological information. If result is POSITIVE SARS-CoV-2 target nucleic acids are DETECTED. The SARS-CoV-2 RNA is generally detectable in upper and lower  respiratory specimens dur ing the acute phase of infection.  Positive  results are indicative of active infection with SARS-CoV-2.  Clinical  correlation with patient history and other diagnostic information is  necessary to determine patient infection status.  Positive results do  not rule out bacterial infection or co-infection with other viruses. If result is PRESUMPTIVE POSTIVE SARS-CoV-2 nucleic acids MAY BE PRESENT.   A presumptive positive result was obtained on the submitted specimen  and confirmed on repeat testing.  While 2019 novel coronavirus  (SARS-CoV-2) nucleic acids may be present in the submitted sample  additional confirmatory testing may be necessary for  epidemiological  and / or clinical management purposes  to differentiate between  SARS-CoV-2 and other Sarbecovirus currently known to infect humans.  If clinically indicated additional testing with an alternate test  methodology [phone]) is advised. The SARS-CoV-2 RNA is generally  detectable in upper and lower respiratory sp ecimens during the acute  phase of infection. The expected result is Negative. Fact Sheet for Patients:  StrictlyIdeas.no Fact Sheet for Healthcare Providers: BankingDealers.co.za This test is not yet approved or cleared by the [place] FDA and has been authorized for detection and/or diagnosis of SARS-CoV-2 by FDA under an Emergency Use Authorization (EUA).  This EUA will remain in effect (meaning this test can be used) for the duration of the COVID-19 declaration under Section 564(b)(1) of the Act, 21 U.S.C. section 360bbb-3(b)(1), unless the authorization is terminated or revoked sooner. Performed at [facility], [address] [place], [place] [address]       Radiology Studies: Dg Chest 1 View  Result Date: 05/09/2019 CLINICAL DATA:  Shortness of breath EXAM: CHEST  1 VIEW COMPARISON:  05/06/2019 FINDINGS: Cardiomegaly with vascular congestion and interstitial prominence, likely interstitial edema. Findings are similar to prior study. No effusions. No acute bony abnormality. IMPRESSION: Cardiomegaly with mild interstitial edema/CHF. Electronically Signed   By: Rolm Baptise M.D.   On: 05/09/2019 20:36   Dg Abd 1 View  Result Date: 05/09/2019 CLINICAL DATA:  Abdominal distention EXAM: ABDOMEN - 1 VIEW COMPARISON:  None. FINDINGS: Nonobstructive bowel gas pattern.  No organomegaly or free air. IMPRESSION: No acute findings. Electronically Signed   By: Rolm Baptise M.D.   On: 05/09/2019 20:35   US Venous Img Lower Bilateral (dvt)  Result Date: 05/10/2019 CLINICAL DATA:  Bilateral lower extremity  edema. EXAM: BILATERAL LOWER EXTREMITY VENOUS DOPPLER ULTRASOUND TECHNIQUE: Gray-scale sonography with graded compression, as well as color Doppler and duplex ultrasound were performed to evaluate the lower extremity deep venous systems from the level of the common femoral vein and including the common femoral, femoral, profunda femoral, popliteal and calf veins including the posterior tibial, peroneal and gastrocnemius veins when visible. The superficial great saphenous vein was also interrogated. Spectral Doppler was utilized to evaluate flow at rest and with distal augmentation maneuvers in the common femoral, femoral and popliteal veins. COMPARISON:  None. FINDINGS: RIGHT LOWER EXTREMITY Common Femoral Vein: No evidence of thrombus. Normal compressibility, respiratory phasicity and response to augmentation. Saphenofemoral Junction: No evidence of thrombus. Normal compressibility and flow on color Doppler imaging. Profunda Femoral Vein: No evidence of thrombus. Normal compressibility and flow on color Doppler imaging. Femoral Vein: No evidence of thrombus. Normal compressibility, respiratory phasicity and response to augmentation. Popliteal Vein: No evidence of thrombus. Normal compressibility, respiratory phasicity and response to augmentation. Calf Veins: No evidence of thrombus. Normal compressibility and flow on color Doppler imaging. Superficial Great Saphenous Vein: No evidence of thrombus. Normal compressibility. Venous Reflux:  None. Other Findings: No evidence of superficial thrombophlebitis or abnormal fluid collection. LEFT LOWER EXTREMITY Common Femoral Vein: No evidence of thrombus. Normal compressibility, respiratory phasicity and response to augmentation. Saphenofemoral Junction: No evidence of thrombus. Normal compressibility and flow on color Doppler imaging. Profunda Femoral Vein: No evidence of thrombus. Normal compressibility and flow on color Doppler imaging. Femoral Vein: No evidence of  thrombus. Normal compressibility, respiratory phasicity and response to augmentation. Popliteal Vein: No evidence of thrombus. Normal compressibility, respiratory phasicity and response to augmentation. Calf Veins: No evidence of thrombus. Normal compressibility and flow on color Doppler imaging. Superficial Great Saphenous Vein: No evidence of thrombus. Normal compressibility. Venous Reflux:  None. Other Findings: No evidence of superficial thrombophlebitis or abnormal  fluid collection. IMPRESSION: No evidence of deep venous thrombosis in either lower extremity. Electronically Signed   By: Irish LackGlenn  Yamagata M.D.   On: 05/10/2019 16:17   Dg Chest Port 1 View  Result Date: 05/10/2019 CLINICAL DATA:  Acute on chronic respiratory failure EXAM: PORTABLE CHEST 1 VIEW COMPARISON:  05/10/2019 FINDINGS: Cardiomegaly with vascular congestion and mild interstitial edema. Findings are similar prior study. No effusions. No acute bony abnormality. IMPRESSION: Mild CHF, similar to prior study. Electronically Signed   By: Charlett NoseKevin  Dover M.D.   On: 05/10/2019 21:43   Dg Chest Port 1 View  Result Date: 05/10/2019 CLINICAL DATA:  AFib with rapid ventricular rate. CHF and pulmonary edema. EXAM: PORTABLE CHEST 1 VIEW COMPARISON:  05/09/2019 FINDINGS: Unchanged cardiac enlargement. Mild diffuse pulmonary edema is similar to previous exam. No pleural effusion identified. IMPRESSION: 1. Cardiac enlargement and pulmonary edema compatible with CHF. Similar to previous exam. Electronically Signed   By: Signa Kellaylor  Stroud M.D.   On: 05/10/2019 10:49        Scheduled Meds:  alum & mag hydroxide-simeth  30 mL Oral Once   And   lidocaine  15 mL Oral Once   amiodarone  200 mg Oral BID   apixaban  5 mg Oral BID   Chlorhexidine Gluconate Cloth  6 each Topical Daily   mouth rinse  15 mL Mouth Rinse BID   pantoprazole (PROTONIX) IV  40 mg Intravenous Q24H   senna-docusate  1 tablet Oral BID   sodium chloride flush  3 mL  Intravenous Q12H   Continuous Infusions:  sodium chloride 250 mL (05/10/19 1145)   phenylephrine (NEO-SYNEPHRINE) Adult infusion 10 mcg/min (05/11/19 0455)     LOS: 5 days    Time spent: 30 min    Lorretta HarpXilin Aloni Chuang, DO [facility] PAGER is on AMION  If 7PM-7AM, please contact night-coverage [email or web address] Password [facility] [email or web address] 05/11/2019, 8:35 AM

## 2019-05-12 LAB — BASIC METABOLIC PANEL
Anion gap: 9 (ref 5–15)
BUN: 17 mg/dL (ref 6–20)
CO2: 28 mmol/L (ref 22–32)
Calcium: 8.8 mg/dL — ABNORMAL LOW (ref 8.9–10.3)
Chloride: 101 mmol/L (ref 98–111)
Creatinine, Ser: 1.49 mg/dL — ABNORMAL HIGH (ref 0.61–1.24)
GFR calc Af Amer: >60 mL/min (ref >60)
GFR calc non Af Amer: 56 mL/min — ABNORMAL LOW (ref >60)
Glucose, Bld: 101 mg/dL — ABNORMAL HIGH (ref 70–99)
Potassium: 3.6 mmol/L (ref 3.5–5.1)
Sodium: 138 mmol/L (ref 135–145)

## 2019-05-12 LAB — GLUCOSE, CAPILLARY
Glucose-Capillary: 101 mg/dL — ABNORMAL HIGH (ref 70–99)
Glucose-Capillary: 101 mg/dL — ABNORMAL HIGH (ref 70–99)
Glucose-Capillary: 118 mg/dL — ABNORMAL HIGH (ref 70–99)
Glucose-Capillary: 119 mg/dL — ABNORMAL HIGH (ref 70–99)
Glucose-Capillary: 140 mg/dL — ABNORMAL HIGH (ref 70–99)
Glucose-Capillary: 97 mg/dL (ref 70–99)

## 2019-05-12 LAB — CBC
HCT: 45.9 % (ref 39.0–52.0)
Hemoglobin: 14.5 g/dL (ref 13.0–17.0)
MCH: 29.2 pg (ref 26.0–34.0)
MCHC: 31.6 g/dL (ref 30.0–36.0)
MCV: 92.4 fL (ref 80.0–100.0)
Platelets: 176 10*3/uL (ref 150–400)
RBC: 4.97 MIL/uL (ref 4.22–5.81)
RDW: 12.3 % (ref 11.5–15.5)
WBC: 4.6 10*3/uL (ref 4.0–10.5)
nRBC: 0 % (ref 0.0–0.2)

## 2019-05-12 LAB — MAGNESIUM: Magnesium: 1.8 mg/dL (ref 1.7–2.4)

## 2019-05-12 LAB — LACTIC ACID, PLASMA: Lactic Acid, Venous: 1 mmol/L (ref 0.5–1.9)

## 2019-05-12 LAB — PROCALCITONIN: Procalcitonin: <0.1 ng/mL

## 2019-05-12 MED ORDER — MAGNESIUM SULFATE 2 GM/50ML IV SOLN
2.0000 g | Freq: Once | INTRAVENOUS | Status: AC
  Administered 2019-05-12: 10:00:00 2 g via INTRAVENOUS
  Filled 2019-05-12: qty 50

## 2019-05-12 MED ORDER — POTASSIUM CHLORIDE CRYS ER 20 MEQ PO TBCR
40.0000 meq | EXTENDED_RELEASE_TABLET | Freq: Once | ORAL | Status: AC
  Administered 2019-05-12: 10:00:00 40 meq via ORAL
  Filled 2019-05-12: qty 2

## 2019-05-12 MED ORDER — METOPROLOL TARTRATE 5 MG/5ML IV SOLN
5.0000 mg | Freq: Once | INTRAVENOUS | Status: DC
  Administered 2019-05-12: 22:00:00 5 mg via INTRAVENOUS

## 2019-05-12 MED ORDER — METOPROLOL TARTRATE 5 MG/5ML IV SOLN
INTRAVENOUS | Status: AC
  Administered 2019-05-12: 22:00:00 5 mg via INTRAVENOUS
  Filled 2019-05-12: qty 5

## 2019-05-12 MED ORDER — METOPROLOL TARTRATE 5 MG/5ML IV SOLN
2.5000 mg | Freq: Once | INTRAVENOUS | Status: AC
  Administered 2019-05-12: 21:00:00 2.5 mg via INTRAVENOUS

## 2019-05-12 NOTE — Progress Notes (Signed)
PROGRESS NOTE    Joshua ConteShawn Huge  ZOX:[id number] DOB: 03-07-1976 DOA: 05/06/2019 PCP: System, Pcp Not In    Brief Narrative:   Joshua Sampson is a 43 y.o. male with medical history significant for morbid obesity, hypertension and cluster headaches.  Patient states he is overall been doing well.  Through keto diet, he has lost 10 to 15 pounds lately intentionally.  He says in the last couple days has not been sleeping great, but overall he sleeps okay.  He is unsure if he snores, sleeps about 5 to 6 hours a night and says he does feel well rested.  Started having a cluster headache 3 days ago.  Intermittent, lasting about 10 minutes, but he had several repeatedly for the past few days including today so he came into the urgent care.  Prior to coming in, patient said he felt some mild chest palpitations and a little short of breath, but that quickly resolved.   43 y.o. 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  Interim History:  1. Yesterday 11/5, pt developed hypotension with blood pressure 85/73 mm Hg and pulse rate 42 beats/min. PCCM was consulted.  Vasopressor, Neo was stared. Pt still is on IV Neo. Bp 116/83 2. He denies chest pain and abdominal pain this morning. 3. Still has HR up to 130s 11/7   Assessment & Plan:   Principal Problem:   Atrial flutter with rapid ventricular response (HCC) Active Problems:   Morbid obesity (HCC)   Hypertension   Headache   CKD (chronic kidney disease), stage II   Chest pain   Elevated troponin   Hypotension   Acute on chronic respiratory failure (HCC)   Atrial flutter (HCC)  Hypotension: Patient's mental status is normal.  Etiology is not clear.  May be related to multiple hypertensive medications.  No fever or leukocytosis, low suspicion for  sepsis.  PCCM was consulted.  Vasopressor, Neo was started.   CXR showed cardiomegaly and vascular congestion without infiltration. Bp 116/83 in AM  -pt is off Vasopressor, Neo, bp is soft, but hemodynamically stable. -f/u PCCM recommendations -Continue BIPAP -Blood cultures no grow so far  New onset atrial flutter with rapid ventricular response [facility](HCC): card was consulted. CHA2DS2-VASc Score is 2, needs oral anticoagulation. Patient is started with Eliquis. ECHO with mildly reduced LV function and EF of 45-50%. - Eliquis 5 mg BID for anticoagulation  - hold  diltiazem 240 mg daily and metoprolol 100 mg bid due to hypotension -on Amiodarone 200 mg bid  New Systolic CHF: ECHO with mildly reduced LV function and EF of 45-50%. No respiratory distress -IV lasix 40 mg bid   Chest pain and troponin elevation:  Patient with mildly elevated HS-troponin to 63 initially, then 62 on repeat. Pt develop chest pain again in later afternoon today.  Stat troponin 28. Had hypotension with SBP in upper 80s which improved to SBP>90s. -will get stat EKG - trend trop - on eliquis,  -Card is on board   Morbid obesity: BMI 63.92 -Diet and exercise.   -Encouraged to lose weight.   Hypertension: now has hypotension -hold Bp meds  CKD (chronic kidney disease), stage II: worsening. baseline Cre 1.2. his Cre is 2.39 -->1.75 -->1.49 and BUN 17. May be due to ATN secondary to hypotension and continuation of diuretics. -f/u by BMP  DVT  prophylaxis: Eliquis Code Status: Full code Family Communication: None Disposition Plan: Possible discharge in 1-2 days  barriers for discharge: still has A Fib with RVR with HR up to 130s.    Consultants:   card  Procedures:    Antimicrobials:    Subjective:  Patient states that his abdominal cramps have resolved.  Currently no abdominal pain, chest pain or shortness of breath.  No fever or chills.   Objective: Vitals:   05/12/19 0500 05/12/19 0600 05/12/19  0700 05/12/19 0740  BP: 122/87 (!) 123/101 (!) 124/104   Pulse: 95 (!) 52 (!) 111 (!) 123  Resp: (!) 21 19 (!) 21 20  Temp:    97.9 F (36.6 C)  TempSrc:    Oral  SpO2: 100% 100% 98% 93%  Weight:      Height:        Intake/Output Summary (Last 24 hours) at 05/12/2019 0856 Last data filed at 05/12/2019 1610 Gross per 24 hour  Intake 364.18 ml  Output 3300 ml  Net -2935.82 ml   Filed Weights   05/10/19 0422 05/11/19 0337 05/12/19 0348  Weight: (!) 208.7 kg (!) 215.4 kg (!) 217 kg    Examination: Physical Exam:  General: Not in acute distress HEENT: PERRL, EOMI, no scleral icterus, No JVD or bruit Cardiac: S1/S2, RRR, No murmurs, gallops or rubs Pulm: Clear to auscultation bilaterally. No rales, wheezing, rhonchi or rubs. Abd: Soft, nondistended, nontender, no rebound pain, no organomegaly, BS present Ext: 1+ leg pitting edema bilaterally. 2+DP/PT pulse bilaterally Musculoskeletal: No joint deformities, erythema, or stiffness, ROM full Skin: No rashes.  Neuro: Alert and oriented X3, cranial nerves II-XII grossly intact. Psych: Patient is not psychotic, no suicidal or hemocidal ideation.    Data Reviewed: I have personally reviewed following labs and imaging studies  CBC: Recent Labs  Lab 05/06/19 1500 05/07/19 0657 05/08/19 0355 05/10/19 0208 05/11/19 0429 05/12/19 0421  WBC 4.7 5.2 5.2 6.5 5.4 4.6  NEUTROABS 2.9  --   --  4.3 2.9  --   HGB 14.7 15.5 15.6 14.9 14.2 14.5  HCT 46.0 47.3 48.0 46.4 45.1 45.9  MCV 91.5 89.8 90.9 92.1 93.4 92.4  PLT 211 205 206 243 188 176   Basic Metabolic Panel: Recent Labs  Lab 05/08/19 0355 05/09/19 2105 05/10/19 0200 05/10/19 0208 05/10/19 1738 05/11/19 0429 05/12/19 0421  NA 140 134*  --  135 137 136 138  K 3.5 3.6  --  4.6 4.3 3.7 3.6  CL 101 99  --  100 101 101 101  CO2 26 20*  --  GLUCOSE 94 165*  --  113* 100* 103* 101*  BUN 17 25*  --  28* 30* 25* 17  CREATININE 1.40* 2.41*  --  2.39* 2.10* 1.75*  1.49*  CALCIUM 9.4 8.7*  --  8.8* 8.9 8.5* 8.8*  MG 1.9 2.2 2.2  --   --  2.0 1.8  PHOS  --  4.8*  --   --   --   --   --    GFR: Estimated Creatinine Clearance: 116.9 mL/min (A) (by C-G formula based on SCr of 1.49 mg/dL (H)). Liver Function Tests: Recent Labs  Lab 05/10/19 0208 05/10/19 1738 05/11/19 0429  AST 54* 38 39  ALT 99* 88* 85*  ALKPHOS 49 51 53  BILITOT 1.5* 1.4* 1.5*  PROT 7.1 6.9 6.7  ALBUMIN 3.7 3.4* 3.4*   No results for input(s): LIPASE, AMYLASE in the  last 168 hours. No results for input(s): AMMONIA in the last 168 hours. Coagulation Profile: Recent Labs  Lab 05/06/19 1935  INR 1.3*   Cardiac Enzymes: No results for input(s): CKTOTAL, CKMB, CKMBINDEX, TROPONINI in the last 168 hours. BNP (last 3 results) No results for input(s): PROBNP in the last 8760 hours. HbA1C: Recent Labs    05/10/19 0200  HGBA1C 5.3   CBG: Recent Labs  Lab 05/11/19 1954 05/11/19 2143 05/11/19 2348 05/12/19 0341 05/12/19 0747  GLUCAP 104* 86 92 101* 118*   Lipid Profile: No results for input(s): CHOL, HDL, LDLCALC, TRIG, CHOLHDL, LDLDIRECT in the last 72 hours. Thyroid Function Tests: Recent Labs    05/09/19 2317  TSH 1.510  FREET4 1.27*   Anemia Panel: No results for input(s): VITAMINB12, FOLATE, FERRITIN, TIBC, IRON, RETICCTPCT in the last 72 hours. Sepsis Labs: Recent Labs  Lab 05/09/19 2117 05/09/19 2317 05/10/19 0200 05/11/19 0429 05/12/19 0421  PROCALCITON <0.10  --   --  <0.10 <0.10  LATICACIDVEN  --  3.0* 2.0*  --  1.0    Recent Results (from the past 240 hour(s))  MRSA PCR Screening     Status: None   Collection Time: 05/06/19  2:19 PM   Specimen: Nasopharyngeal  Result Value Ref Range Status   MRSA by PCR NEGATIVE NEGATIVE Final    Comment:        The GeneXpert MRSA Assay (FDA approved for NASAL specimens only), is one component of a comprehensive MRSA colonization surveillance program. It is not intended to diagnose MRSA infection  nor to guide or monitor treatment for MRSA infections. Performed at [facility], [address] [place], [place] [address]   SARS Coronavirus 2 by RT PCR (hospital order, performed in [facility] hospital lab) Nasopharyngeal Nasopharyngeal Swab     Status: None   Collection Time: 05/06/19  4:24 PM   Specimen: Nasopharyngeal Swab  Result Value Ref Range Status   SARS Coronavirus 2 NEGATIVE NEGATIVE Final    Comment: (NOTE) If result is NEGATIVE SARS-CoV-2 target nucleic acids are NOT DETECTED. The SARS-CoV-2 RNA is generally detectable in upper and lower  respiratory specimens during the acute phase of infection. The lowest  concentration of SARS-CoV-2 viral copies this assay can detect is 250  copies / mL. A negative result does not preclude SARS-CoV-2 infection  and should not be used as the sole basis for treatment or other  patient management decisions.  A negative result may occur with  improper specimen collection / handling, submission of specimen other  than nasopharyngeal swab, presence of viral mutation(s) within the  areas targeted by this assay, and inadequate number of viral copies  (<250 copies / mL). A negative result must be combined with clinical  observations, patient history, and epidemiological information. If result is POSITIVE SARS-CoV-2 target nucleic acids are DETECTED. The SARS-CoV-2 RNA is generally detectable in upper and lower  respiratory specimens dur ing the acute phase of infection.  Positive  results are indicative of active infection with SARS-CoV-2.  Clinical  correlation with patient history and other diagnostic information is  necessary to determine patient infection status.  Positive results do  not rule out bacterial infection or co-infection with other viruses. If result is PRESUMPTIVE POSTIVE SARS-CoV-2 nucleic acids MAY BE PRESENT.   A presumptive positive result was obtained on the submitted specimen  and confirmed on repeat  testing.  While 2019 novel coronavirus  (SARS-CoV-2) nucleic acids may be present in the submitted sample  additional confirmatory testing may be necessary for epidemiological  and / or clinical management purposes  to differentiate between  SARS-CoV-2 and other Sarbecovirus currently known to infect humans.  If clinically indicated additional testing with an alternate test  methodology [phone]) is advised. The SARS-CoV-2 RNA is generally  detectable in upper and lower respiratory sp ecimens during the acute  phase of infection. The expected result is Negative. Fact Sheet for Patients:  StrictlyIdeas.no Fact Sheet for Healthcare Providers: BankingDealers.co.za This test is not yet approved or cleared by the [place] FDA and has been authorized for detection and/or diagnosis of SARS-CoV-2 by FDA under an Emergency Use Authorization (EUA).  This EUA will remain in effect (meaning this test can be used) for the duration of the COVID-19 declaration under Section 564(b)(1) of the Act, 21 U.S.C. section 360bbb-3(b)(1), unless the authorization is terminated or revoked sooner. Performed at [facility], [address] [place], [place] [address]       Radiology Studies: US Venous Img Lower Bilateral (dvt)  Result Date: 05/10/2019 CLINICAL DATA:  Bilateral lower extremity edema. EXAM: BILATERAL LOWER EXTREMITY VENOUS DOPPLER ULTRASOUND TECHNIQUE: Gray-scale sonography with graded compression, as well as color Doppler and duplex ultrasound were performed to evaluate the lower extremity deep venous systems from the level of the common femoral vein and including the common femoral, femoral, profunda femoral, popliteal and calf veins including the posterior tibial, peroneal and gastrocnemius veins when visible. The superficial great saphenous vein was also interrogated. Spectral Doppler was utilized to evaluate flow at rest and with  distal augmentation maneuvers in the common femoral, femoral and popliteal veins. COMPARISON:  None. FINDINGS: RIGHT LOWER EXTREMITY Common Femoral Vein: No evidence of thrombus. Normal compressibility, respiratory phasicity and response to augmentation. Saphenofemoral Junction: No evidence of thrombus. Normal compressibility and flow on color Doppler imaging. Profunda Femoral Vein: No evidence of thrombus. Normal compressibility and flow on color Doppler imaging. Femoral Vein: No evidence of thrombus. Normal compressibility, respiratory phasicity and response to augmentation. Popliteal Vein: No evidence of thrombus. Normal compressibility, respiratory phasicity and response to augmentation. Calf Veins: No evidence of thrombus. Normal compressibility and flow on color Doppler imaging. Superficial Great Saphenous Vein: No evidence of thrombus. Normal compressibility. Venous Reflux:  None. Other Findings: No evidence of superficial thrombophlebitis or abnormal fluid collection. LEFT LOWER EXTREMITY Common Femoral Vein: No evidence of thrombus. Normal compressibility, respiratory phasicity and response to augmentation. Saphenofemoral Junction: No evidence of thrombus. Normal compressibility and flow on color Doppler imaging. Profunda Femoral Vein: No evidence of thrombus. Normal compressibility and flow on color Doppler imaging. Femoral Vein: No evidence of thrombus. Normal compressibility, respiratory phasicity and response to augmentation. Popliteal Vein: No evidence of thrombus. Normal compressibility, respiratory phasicity and response to augmentation. Calf Veins: No evidence of thrombus. Normal compressibility and flow on color Doppler imaging. Superficial Great Saphenous Vein: No evidence of thrombus. Normal compressibility. Venous Reflux:  None. Other Findings: No evidence of superficial thrombophlebitis or abnormal fluid collection. IMPRESSION: No evidence of deep venous thrombosis in either lower extremity.  Electronically Signed   By: Aletta Edouard M.D.   On: 05/10/2019 16:17   Dg Chest Port 1 View  Result Date: 05/10/2019 CLINICAL DATA:  Acute on chronic respiratory failure EXAM: PORTABLE CHEST 1 VIEW COMPARISON:  05/10/2019 FINDINGS: Cardiomegaly with vascular congestion and mild interstitial edema. Findings are similar prior study. No effusions. No acute bony abnormality. IMPRESSION: Mild CHF, similar to prior study. Electronically Signed   By: Rolm Baptise  M.D.   On: 05/10/2019 21:43   Dg Chest Port 1 View  Result Date: 05/10/2019 CLINICAL DATA:  AFib with rapid ventricular rate. CHF and pulmonary edema. EXAM: PORTABLE CHEST 1 VIEW COMPARISON:  05/09/2019 FINDINGS: Unchanged cardiac enlargement. Mild diffuse pulmonary edema is similar to previous exam. No pleural effusion identified. IMPRESSION: 1. Cardiac enlargement and pulmonary edema compatible with CHF. Similar to previous exam. Electronically Signed   By: Signa Kell M.D.   On: 05/10/2019 10:49        Scheduled Meds:  alum & mag hydroxide-simeth  30 mL Oral Once   And   lidocaine  15 mL Oral Once   amiodarone  200 mg Oral BID   apixaban  5 mg Oral BID   Chlorhexidine Gluconate Cloth  6 each Topical Daily   furosemide  40 mg Intravenous BID   mouth rinse  15 mL Mouth Rinse BID   pantoprazole (PROTONIX) IV  40 mg Intravenous Q24H   polyethylene glycol  17 g Oral Daily   potassium chloride  40 mEq Oral Once   senna-docusate  1 tablet Oral BID   sodium chloride flush  3 mL Intravenous Q12H   Continuous Infusions:  sodium chloride 250 mL (05/10/19 1145)   magnesium sulfate bolus IVPB       LOS: 6 days    Time spent: 30 min    Lorretta Harp, DO [facility] PAGER is on AMION  If 7PM-7AM, please contact night-coverage [email or web address] Password [facility] 05/12/2019, 8:56 AM

## 2019-05-12 NOTE — Progress Notes (Signed)
Ch f/u with pt to determine how well he was progressing. Pt was off Bipap and shared that he only needs it at night as of now. Pt was able to converse without experiencing SOB as the ch asked guided questions regarding how pt felt hew was improving. 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 Pt expressed concerns about being at an out-of-network facility due to his health insurance for coverage. Ch understood his concern and offered a compassionate presence and social support. Pt appreciated the f/u visit.        05/12/19 1100  Clinical Encounter Type  Visited With Patient  Visit Type Spiritual support;Social support  Stress Factors  Patient Stress Factors Health changes  Family Stress Factors Major life changes

## 2019-05-12 NOTE — Progress Notes (Signed)
CRITICAL CARE PROGRESS NOTE    Name: Joshua Sampson MRN: [id number] DOB: Apr 02, 1976     LOS: 6   SUBJECTIVE FINDINGS & SIGNIFICANT EVENTS   Patient description:   43 yo male admitted to the stepdown unit with new onset atrial fibrillation with rvr initially requiring cardizem gtt and acute hypoxic respiratory failure secondary to suspected undiagnosed OSA/OHS/vascular congestion/possible pulmonary embolism. Pt transitioned to po antiarrhythmic medication and cardizem gtt discontinued, pt transferred to the telemetry unit 11/3.  He required transfer back to the stepdown unit and subsequently changed to ICU status 11/4 with hypotension likely secondary to antihypertensive and antiarrhythmic medications requiring neo-synephrine gtt and worsening acute hypoxic respiratory failure secondary to vascular congestion  requiring Bipap   SIGNIFICANT EVENTS/STUDIES:  11/1: Pt admitted to the stepdown unit with new onset atrial fibrillation with rvr requiring cardizem gtt  11/2: Echo revealed EF 45 to 50%, borderline left ventricular hypertrophy, right ventricular size normal, no increase in right ventricular wall thickness, and global RV systolic function has normal systolic function. 11/3: Pt transitioned off cardizem gtt and started on oral cardizem.  Oral eliquis started for preparation of likely cardioversion in the outpatient setting per Cardiology 11/3: Pts heart rate remained stable, therefore transferred to the telemetry unit  11/4: Pt developed indigestion then 10/10 chest tightness, dyspnea, and diaphoretic (O2 applied/nitro given/EKG obtained revealing atrial fibrillation hr 89-low 100's).  Post treatment pt became hypotensive, therefore rapid response initiated and pt transferred to the stepdown unit.  Upon arrival to the  stepdown unit pt remained hypotensive and diaphoretic PCCM consulted 11/4.   11/4: Pt remained hypotensive requiring levophed gtt and repeat lab workup per PCCM team.  Pt continued to have dyspnea with exertion. CXR concerning for vascular congestion, however due to pressor requirement unable to diurese.  Pt c/o constipation and epigastric pain KUB negative 11/6 - patient improved, had episode of AFRVR today but asymptomatic on 2Lnc 11/7-met with wife to discuss care plan and reason for MICU admission    Lines / Drains: PIV  Cultures / Sepsis markers: Pro calcitonin less than 0.10 Blood cultures x2   Antibiotics: None   Protocols / Consultants: Cardiology, critical care   Overnight: No overnight events   PAST MEDICAL HISTORY   Past Medical History:  Diagnosis Date   Hypertension    Obesity      SURGICAL HISTORY   Past Surgical History:  Procedure Laterality Date   CHOLECYSTECTOMY       FAMILY HISTORY   Family History  Problem Relation Age of Onset   Hypertension Mother    Hypertension Father    Stroke Brother      SOCIAL HISTORY   Social History   Tobacco Use   Smoking status: Never Smoker   Smokeless tobacco: Never Used  Substance Use Topics   Alcohol use: Not Currently   Drug use: Never     MEDICATIONS   Current Medication:  Current Facility-Administered Medications:    0.9 %  sodium chloride infusion, 250 mL, Intravenous, Continuous, Nykeria Mealing, MD, Last Rate: 20 mL/hr at 05/10/19 1145, 250 mL at 05/10/19 1145   acetaminophen (TYLENOL) tablet 650 mg, 650 mg, Oral, Q6H PRN **OR** acetaminophen (TYLENOL) suppository 650 mg, 650 mg, Rectal, Q6H PRN, Jennye Boroughs, MD   alum & mag hydroxide-simeth (MAALOX/MYLANTA) 200-200-20 MG/5ML suspension 30 mL, 30 mL, Oral, Q4H PRN, Ivor Costa, MD, 30 mL at 05/09/19 1659   alum & mag hydroxide-simeth (MAALOX/MYLANTA) 200-200-20 MG/5ML suspension 30 mL, 30 mL, Oral,  Once, Stopped at  05/09/19 2256 **AND** lidocaine (XYLOCAINE) 2 % viscous mouth solution 15 mL, 15 mL, Oral, Once, Awilda Bill, NP, Stopped at 05/09/19 2256   amiodarone (PACERONE) tablet 200 mg, 200 mg, Oral, BID, Blakeney, Dana G, NP, 200 mg at 05/11/19 2350   apixaban (ELIQUIS) tablet 5 mg, 5 mg, Oral, BID, Awilda Bill, NP, 5 mg at 05/12/19 0106   Chlorhexidine Gluconate Cloth 2 % PADS 6 each, 6 each, Topical, Daily, Ottie Glazier, MD, 6 each at 05/11/19 2133   furosemide (LASIX) injection 40 mg, 40 mg, Intravenous, BID, Ottie Glazier, MD, 40 mg at 05/11/19 1540   levalbuterol (XOPENEX) nebulizer solution 0.63 mg, 0.63 mg, Nebulization, Q6H PRN, Awilda Bill, NP   MEDLINE mouth rinse, 15 mL, Mouth Rinse, BID, Blakeney, Dreama Saa, NP   morphine 2 MG/ML injection 2 mg, 2 mg, Intravenous, Q4H PRN, Lang Snow, NP, 2 mg at 05/09/19 2011   ondansetron (ZOFRAN) tablet 4 mg, 4 mg, Oral, Q6H PRN **OR** ondansetron (ZOFRAN) injection 4 mg, 4 mg, Intravenous, Q6H PRN, Jennye Boroughs, MD, 4 mg at 05/09/19 1835   pantoprazole (PROTONIX) injection 40 mg, 40 mg, Intravenous, Q24H, Charlett Nose, RPH, 40 mg at 05/11/19 2131   polyethylene glycol (MIRALAX / GLYCOLAX) packet 17 g, 17 g, Oral, Daily PRN, Jennye Boroughs, MD, 17 g at 05/09/19 2035   polyethylene glycol (MIRALAX / GLYCOLAX) packet 17 g, 17 g, Oral, Daily, Charlett Nose, RPH, 17 g at 05/11/19 1122   senna-docusate (Senokot-S) tablet 1 tablet, 1 tablet, Oral, BID, Ivor Costa, MD, 1 tablet at 05/11/19 1113   sodium chloride flush (NS) 0.9 % injection 3 mL, 3 mL, Intravenous, Q12H, Jennye Boroughs, MD, 3 mL at 05/11/19 2134    ALLERGIES   Patient has no known allergies.    REVIEW OF SYSTEMS     10 point review of systems conducted is negative except as per subjective findings PHYSICAL EXAMINATION   Vital Signs: Temp:  [97.6 F (36.4 C)-98.2 F (36.8 C)] 97.9 F (36.6 C) (11/07 0740) Pulse Rate:  [33-130]  123 (11/07 0740) Resp:  [16-30] 20 (11/07 0740) BP: (104-151)/(83-137) 124/104 (11/07 0700) SpO2:  [86 %-100 %] 93 % (11/07 0740) Weight:  [568 kg] 217 kg (11/07 0348)  GENERAL: Obese age-appropriate HEAD: Normocephalic, atraumatic.  EYES: Pupils equal, round, reactive to light.  No scleral icterus.  MOUTH: Moist mucosal membrane. NECK: Supple. No thyromegaly. No nodules. No JVD.  PULMONARY: Clear to auscultation bilaterally CARDIOVASCULAR: S1 and S2. IRRegular rate and rhythm. No murmurs, rubs, or gallops.  GASTROINTESTINAL: Soft, nontender, non-distended. No masses. Positive bowel sounds. No hepatosplenomegaly.  MUSCULOSKELETAL: No swelling, clubbing, or edema.  NEUROLOGIC: Mild distress due to acute illness SKIN:intact,warm,dry   PERTINENT DATA     Infusions:  sodium chloride 250 mL (05/10/19 1145)   Scheduled Medications:  alum & mag hydroxide-simeth  30 mL Oral Once   And   lidocaine  15 mL Oral Once   amiodarone  200 mg Oral BID   apixaban  5 mg Oral BID   Chlorhexidine Gluconate Cloth  6 each Topical Daily   furosemide  40 mg Intravenous BID   mouth rinse  15 mL Mouth Rinse BID   pantoprazole (PROTONIX) IV  40 mg Intravenous Q24H   polyethylene glycol  17 g Oral Daily   senna-docusate  1 tablet Oral BID   sodium chloride flush  3 mL Intravenous Q12H   PRN Medications: acetaminophen **OR** acetaminophen,  alum & mag hydroxide-simeth, levalbuterol, morphine injection, ondansetron **OR** ondansetron (ZOFRAN) IV, polyethylene glycol Hemodynamic parameters:   Intake/Output: 11/06 0701 - 11/07 0700 In: 364.2 [P.O.:360; I.V.:4.2] Out: 3300 [Urine:3300]  Ventilator  Settings:      LAB RESULTS:  Basic Metabolic Panel: Recent Labs  Lab 05/08/19 0355 05/09/19 2105 05/10/19 0200 05/10/19 0208 05/10/19 1738 05/11/19 0429 05/12/19 0421  NA 140 134*  --  135 137 136 138  K 3.5 3.6  --  4.6 4.3 3.7 3.6  CL 101 99  --  100 101 101 101  CO2 26 20*   --  24 24 26 28   GLUCOSE 94 165*  --  113* 100* 103* 101*  BUN 17 25*  --  28* 30* 25* 17  CREATININE 1.40* 2.41*  --  2.39* 2.10* 1.75* 1.49*  CALCIUM 9.4 8.7*  --  8.8* 8.9 8.5* 8.8*  MG 1.9 2.2 2.2  --   --  2.0 1.8  PHOS  --  4.8*  --   --   --   --   --    Liver Function Tests: Recent Labs  Lab 05/10/19 0208 05/10/19 1738 05/11/19 0429  AST 54* 38 39  ALT 99* 88* 85*  ALKPHOS 49 51 53  BILITOT 1.5* 1.4* 1.5*  PROT 7.1 6.9 6.7  ALBUMIN 3.7 3.4* 3.4*   No results for input(s): LIPASE, AMYLASE in the last 168 hours. No results for input(s): AMMONIA in the last 168 hours. CBC: Recent Labs  Lab 05/06/19 1500 05/07/19 0657 05/08/19 0355 05/10/19 0208 05/11/19 0429 05/12/19 0421  WBC 4.7 5.2 5.2 6.5 5.4 4.6  NEUTROABS 2.9  --   --  4.3 2.9  --   HGB 14.7 15.5 15.6 14.9 14.2 14.5  HCT 46.0 47.3 48.0 46.4 45.1 45.9  MCV 91.5 89.8 90.9 92.1 93.4 92.4  PLT 211 205 206 243 188 176   Cardiac Enzymes: No results for input(s): CKTOTAL, CKMB, CKMBINDEX, TROPONINI in the last 168 hours. BNP: Invalid input(s): POCBNP CBG: Recent Labs  Lab 05/11/19 1954 05/11/19 2143 05/11/19 2348 05/12/19 0341 05/12/19 0747  GLUCAP 104* 86 92 101* 118*     IMAGING RESULTS:  Imaging: US Venous Img Lower Bilateral (dvt)  Result Date: 05/10/2019 CLINICAL DATA:  Bilateral lower extremity edema. EXAM: BILATERAL LOWER EXTREMITY VENOUS DOPPLER ULTRASOUND TECHNIQUE: Gray-scale sonography with graded compression, as well as color Doppler and duplex ultrasound were performed to evaluate the lower extremity deep venous systems from the level of the common femoral vein and including the common femoral, femoral, profunda femoral, popliteal and calf veins including the posterior tibial, peroneal and gastrocnemius veins when visible. The superficial great saphenous vein was also interrogated. Spectral Doppler was utilized to evaluate flow at rest and with distal augmentation maneuvers in the common  femoral, femoral and popliteal veins. COMPARISON:  None. FINDINGS: RIGHT LOWER EXTREMITY Common Femoral Vein: No evidence of thrombus. Normal compressibility, respiratory phasicity and response to augmentation. Saphenofemoral Junction: No evidence of thrombus. Normal compressibility and flow on color Doppler imaging. Profunda Femoral Vein: No evidence of thrombus. Normal compressibility and flow on color Doppler imaging. Femoral Vein: No evidence of thrombus. Normal compressibility, respiratory phasicity and response to augmentation. Popliteal Vein: No evidence of thrombus. Normal compressibility, respiratory phasicity and response to augmentation. Calf Veins: No evidence of thrombus. Normal compressibility and flow on color Doppler imaging. Superficial Great Saphenous Vein: No evidence of thrombus. Normal compressibility. Venous Reflux:  None. Other Findings: No evidence of superficial thrombophlebitis  or abnormal fluid collection. LEFT LOWER EXTREMITY Common Femoral Vein: No evidence of thrombus. Normal compressibility, respiratory phasicity and response to augmentation. Saphenofemoral Junction: No evidence of thrombus. Normal compressibility and flow on color Doppler imaging. Profunda Femoral Vein: No evidence of thrombus. Normal compressibility and flow on color Doppler imaging. Femoral Vein: No evidence of thrombus. Normal compressibility, respiratory phasicity and response to augmentation. Popliteal Vein: No evidence of thrombus. Normal compressibility, respiratory phasicity and response to augmentation. Calf Veins: No evidence of thrombus. Normal compressibility and flow on color Doppler imaging. Superficial Great Saphenous Vein: No evidence of thrombus. Normal compressibility. Venous Reflux:  None. Other Findings: No evidence of superficial thrombophlebitis or abnormal fluid collection. IMPRESSION: No evidence of deep venous thrombosis in either lower extremity. Electronically Signed   By: Aletta Edouard  M.D.   On: 05/10/2019 16:17   Dg Chest Port 1 View  Result Date: 05/10/2019 CLINICAL DATA:  Acute on chronic respiratory failure EXAM: PORTABLE CHEST 1 VIEW COMPARISON:  05/10/2019 FINDINGS: Cardiomegaly with vascular congestion and mild interstitial edema. Findings are similar prior study. No effusions. No acute bony abnormality. IMPRESSION: Mild CHF, similar to prior study. Electronically Signed   By: Rolm Baptise M.D.   On: 05/10/2019 21:43   Dg Chest Port 1 View  Result Date: 05/10/2019 CLINICAL DATA:  AFib with rapid ventricular rate. CHF and pulmonary edema. EXAM: PORTABLE CHEST 1 VIEW COMPARISON:  05/09/2019 FINDINGS: Unchanged cardiac enlargement. Mild diffuse pulmonary edema is similar to previous exam. No pleural effusion identified. IMPRESSION: 1. Cardiac enlargement and pulmonary edema compatible with CHF. Similar to previous exam. Electronically Signed   By: Kerby Moors M.D.   On: 05/10/2019 10:49      ASSESSMENT AND PLAN    -Multidisciplinary rounds held today  Acute Hypoxic Respiratory Failure -due to acute CHF exacerbation with reduced EF  -continue supplemental O2 -Diuresis with 40 lasix BID x 2 days -strict I&O -fluid restriction 1234m/24h -CXR with interstitial edema -cardiology on case - appreciate input  Atrial fibrillation with RVR - on amio 200 bid  -holding BB and CCB due to hypotension on arrival to MICU -oxygen as needed ICU monitoring  Renal Failure-acute onchronic stage 3 -follow chem 7 -follow UO -continue Foley Catheter-assess need daily  Morbid obesity with probable OSA - outpatient PSG  - weight loss    Elevated troponin    -mild and likely clinically insignificant in setting of renal impairment   GERD    Likely cause of epigastric discomofort post prandially  ID -continue IV abx as prescibed -follow up cultures  GI/Nutrition GI PROPHYLAXIS as indicated DIET-->TF's as tolerated Constipation protocol as indicated  ENDO -  ICU hypoglycemic\Hyperglycemia protocol -check FSBS per protocol   ELECTROLYTES -follow labs as needed -replace as needed -pharmacy consultation   DVT/GI PRX ordered -SCDs  TRANSFUSIONS AS NEEDED MONITOR FSBS ASSESS the need for LABS as needed   Critical care provider statement:    Critical care time (minutes):  32   Critical care time was exclusive of:  Separately billable procedures and treating other patients   Critical care was necessary to treat or prevent imminent or life-threatening deterioration of the following conditions:   Acute hypoxemic respiratory failure, new onset CHF with acute exacerbation, atrial fibrillation with rapid ventricular response, morbid obesity, multiple comorbid conditions   Critical care was time spent personally by me on the following activities:  Development of treatment plan with patient or surrogate, discussions with consultants, evaluation of patient's response to treatment, examination  of patient, obtaining history from patient or surrogate, ordering and performing treatments and interventions, ordering and review of laboratory studies and re-evaluation of patient's condition.  I assumed direction of critical care for this patient from another provider in my specialty: no    This document was prepared using Dragon voice recognition software and may include unintentional dictation errors.    Ottie Glazier, M.D.  Div[facility] of [place]

## 2019-05-12 NOTE — Progress Notes (Signed)
Patient's heart rate has been 120s-150s with soft BP.    RN notified Dr. Lanney Gins, he stated that Cardiology was following.    RN notified Dr. Soledad Gerlach who is the patient's attending today.  Dr. Soledad Gerlach asked RN to notify Dr. Nehemiah Massed.   RN notified Dr. Nehemiah Massed.   Patient denies pain.    Phillis Knack, RN

## 2019-05-12 NOTE — Progress Notes (Signed)
[facility]  SUBJECTIVE: Patient sitting up in bed, denies chest pain or shortness of breath   Vitals:   05/12/19 0500 05/12/19 0600 05/12/19 0700 05/12/19 0740  BP: 122/87 (!) 123/101 (!) 124/104   Pulse: 95 (!) 52 (!) 111 (!) 123  Resp: (!) 21 19 (!) 21 20  Temp:    97.9 F (36.6 C)  TempSrc:    Oral  SpO2: 100% 100% 98% 93%  Weight:      Height:         Intake/Output Summary (Last 24 hours) at 05/12/2019 0830 Last data filed at 05/12/2019 [id number] Gross per 24 hour  Intake 364.18 ml  Output 3300 ml  Net -2935.82 ml      PHYSICAL EXAM  General: Well developed, well nourished, in no acute distress HEENT:  Normocephalic and atramatic Neck:  No JVD.  Lungs: Clear bilaterally to auscultation and percussion. Heart: Tachycardia. Normal S1 and S2 without gallops or murmurs.  Abdomen: Bowel sounds are positive, abdomen soft and non-tender  Msk:  Back normal, normal gait. Normal strength and tone for age. Extremities: No clubbing, cyanosis or edema.   Neuro: Alert and oriented X 3. Psych:  Good affect, responds appropriately   LABS: Basic Metabolic Panel: Recent Labs    05/09/19 2105  05/11/19 0429 05/12/19 0421  NA 134*   < > 136 138  K 3.6   < > 3.7 3.6  CL 99   < > 101 101  CO2 20*   < > 26 28  GLUCOSE 165*   < > 103* 101*  BUN 25*   < > 25* 17  CREATININE 2.41*   < > 1.75* 1.49*  CALCIUM 8.7*   < > 8.5* 8.8*  MG 2.2   < > 2.0 1.8  PHOS 4.8*  --   --   --    < > = values in this interval not displayed.   Liver Function Tests: Recent Labs    05/10/19 1738 05/11/19 0429  AST 38 39  ALT 88* 85*  ALKPHOS 51 53  BILITOT 1.4* 1.5*  PROT 6.9 6.7  ALBUMIN 3.4* 3.4*   No results for input(s): LIPASE, AMYLASE in the last 72 hours. CBC: Recent Labs    05/10/19 0208 05/11/19 0429 05/12/19 0421  WBC 6.5 5.4 4.6  NEUTROABS 4.3 2.9  --   HGB 14.9 14.2 14.5  HCT 46.4 45.1 45.9  MCV 92.1 93.4 92.4  PLT 243 188 176   Cardiac Enzymes: No results for  input(s): CKTOTAL, CKMB, CKMBINDEX, TROPONINI in the last 72 hours. BNP: Invalid input(s): POCBNP D-Dimer: No results for input(s): DDIMER in the last 72 hours. Hemoglobin A1C: Recent Labs    05/10/19 0200  HGBA1C 5.3   Fasting Lipid Panel: No results for input(s): CHOL, HDL, LDLCALC, TRIG, CHOLHDL, LDLDIRECT in the last 72 hours. Thyroid Function Tests: Recent Labs    05/09/19 2317  TSH 1.510   Anemia Panel: No results for input(s): VITAMINB12, FOLATE, FERRITIN, TIBC, IRON, RETICCTPCT in the last 72 hours.  [place] Venous Img Lower Bilateral (dvt)  Result Date: 05/10/2019 CLINICAL DATA:  Bilateral lower extremity edema. EXAM: BILATERAL LOWER EXTREMITY VENOUS DOPPLER ULTRASOUND TECHNIQUE: Gray-scale sonography with graded compression, as well as color Doppler and duplex ultrasound were performed to evaluate the lower extremity deep venous systems from the level of the common femoral vein and including the common femoral, femoral, profunda femoral, popliteal and calf veins including the posterior tibial, peroneal and gastrocnemius veins when  visible. The superficial great saphenous vein was also interrogated. Spectral Doppler was utilized to evaluate flow at rest and with distal augmentation maneuvers in the common femoral, femoral and popliteal veins. COMPARISON:  None. FINDINGS: RIGHT LOWER EXTREMITY Common Femoral Vein: No evidence of thrombus. Normal compressibility, respiratory phasicity and response to augmentation. Saphenofemoral Junction: No evidence of thrombus. Normal compressibility and flow on color Doppler imaging. Profunda Femoral Vein: No evidence of thrombus. Normal compressibility and flow on color Doppler imaging. Femoral Vein: No evidence of thrombus. Normal compressibility, respiratory phasicity and response to augmentation. Popliteal Vein: No evidence of thrombus. Normal compressibility, respiratory phasicity and response to augmentation. Calf Veins: No evidence of thrombus.  Normal compressibility and flow on color Doppler imaging. Superficial Great Saphenous Vein: No evidence of thrombus. Normal compressibility. Venous Reflux:  None. Other Findings: No evidence of superficial thrombophlebitis or abnormal fluid collection. LEFT LOWER EXTREMITY Common Femoral Vein: No evidence of thrombus. Normal compressibility, respiratory phasicity and response to augmentation. Saphenofemoral Junction: No evidence of thrombus. Normal compressibility and flow on color Doppler imaging. Profunda Femoral Vein: No evidence of thrombus. Normal compressibility and flow on color Doppler imaging. Femoral Vein: No evidence of thrombus. Normal compressibility, respiratory phasicity and response to augmentation. Popliteal Vein: No evidence of thrombus. Normal compressibility, respiratory phasicity and response to augmentation. Calf Veins: No evidence of thrombus. Normal compressibility and flow on color Doppler imaging. Superficial Great Saphenous Vein: No evidence of thrombus. Normal compressibility. Venous Reflux:  None. Other Findings: No evidence of superficial thrombophlebitis or abnormal fluid collection. IMPRESSION: No evidence of deep venous thrombosis in either lower extremity. Electronically Signed   By: Irish Lack M.D.   On: 05/10/2019 16:17   Dg Chest Port 1 View  Result Date: 05/10/2019 CLINICAL DATA:  Acute on chronic respiratory failure EXAM: PORTABLE CHEST 1 VIEW COMPARISON:  05/10/2019 FINDINGS: Cardiomegaly with vascular congestion and mild interstitial edema. Findings are similar prior study. No effusions. No acute bony abnormality. IMPRESSION: Mild CHF, similar to prior study. Electronically Signed   By: Charlett Nose M.D.   On: 05/10/2019 21:43   Dg Chest Port 1 View  Result Date: 05/10/2019 CLINICAL DATA:  AFib with rapid ventricular rate. CHF and pulmonary edema. EXAM: PORTABLE CHEST 1 VIEW COMPARISON:  05/09/2019 FINDINGS: Unchanged cardiac enlargement. Mild diffuse pulmonary  edema is similar to previous exam. No pleural effusion identified. IMPRESSION: 1. Cardiac enlargement and pulmonary edema compatible with CHF. Similar to previous exam. Electronically Signed   By: Signa Kell M.D.   On: 05/10/2019 10:49     Echo LVEF 45 to 50%  TELEMETRY: Atrial fibrillation, heart rate 110 to 130 bpm:  ASSESSMENT AND PLAN:  Principal Problem:   Atrial flutter with rapid ventricular response (HCC) Active Problems:   Morbid obesity (HCC)   Hypertension   Headache   CKD (chronic kidney disease), stage II   Chest pain   Elevated troponin   Hypotension   Acute on chronic respiratory failure (HCC)   Atrial flutter (HCC)    1.  New onset atrial fibrillation, with RVR, initially on metoprolol 100 mg twice daily and diltiazem to 40 mg daily, discontinued due to hypotension and bradycardia.  Patient was started on amiodarone 200 mg twice daily 05/11/2019 for rate and rhythm control.  Patient also started on Eliquis 5 mg twice daily for stroke prevention. 2.  Hypotension, resolved, off of pressors 3.  New onset acute diastolic CHF, with LVEF 45-50%, improved after initial diuresis 4.  Morbid obesity /obstructive sleep  apnea 5.  Chest pain, atypical, borderline elevated high-sensitivity troponin, likely demand supply ischemia not acute coronary syndrome  Recommendations  1.  Agree with overall current therapy 2.  Continue amiodarone 200 mg twice daily rate and rhythm control 3.  Continue Eliquis 5 mg twice daily for stroke prevention 4.  Will consider restarting low-dose metoprolol or diltiazem if clinically indicated 5.  Patient appears to be clinically stable for transfer to 2A telemetry 6.  Ischemic work-up as outpatient per Dr. Rayetta Humphrey, MD, PhD, [facility] 05/12/2019 8:30 AM

## 2019-05-12 NOTE — Progress Notes (Signed)
Pharmacy Electrolyte Monitoring Consult:  Pharmacy consulted to assist in monitoring and replacing electrolytes in this 43 y.o. male admitted on 05/06/2019 with Headache, Tachycardia, and Shortness of Breath   Labs:  Sodium (mmol/L)  Date Value  05/12/2019 138   Potassium (mmol/L)  Date Value  05/12/2019 3.6   Magnesium (mg/dL)  Date Value  05/12/2019 1.8   Phosphorus (mg/dL)  Date Value  05/09/2019 4.8 (H)   Calcium (mg/dL)  Date Value  05/12/2019 8.8 (L)   Albumin (g/dL)  Date Value  05/11/2019 3.4 (L)    Assessment/Plan: Pt is on NS infusion running at 10-20 mL/hr, lasix 40 mg BID IV. K+ 3.6 and Mg 1.8.   Will give Potassium 77mEq x 1 PO and Mg 2 g x 1 IV.   Will obtain BMP/Magnesium with am labs.   Will replace for goal potassium ~ 4 and goal magnesium ~ 2.   Pharmacy will continue to monitor and adjust per consult.   Oswald Hillock, PharmD, BCPS 05/12/2019 8:38 AM

## 2019-05-13 DIAGNOSIS — J9620 Acute and chronic respiratory failure, unspecified whether with hypoxia or hypercapnia: Secondary | ICD-10-CM

## 2019-05-13 DIAGNOSIS — N179 Acute kidney failure, unspecified: Secondary | ICD-10-CM

## 2019-05-13 DIAGNOSIS — I5021 Acute systolic (congestive) heart failure: Secondary | ICD-10-CM | POA: Diagnosis present

## 2019-05-13 DIAGNOSIS — J9621 Acute and chronic respiratory failure with hypoxia: Secondary | ICD-10-CM

## 2019-05-13 DIAGNOSIS — N390 Urinary tract infection, site not specified: Secondary | ICD-10-CM | POA: Diagnosis present

## 2019-05-13 LAB — BASIC METABOLIC PANEL
Anion gap: 9 (ref 5–15)
BUN: 15 mg/dL (ref 6–20)
CO2: 28 mmol/L (ref 22–32)
Calcium: 8.9 mg/dL (ref 8.9–10.3)
Chloride: 101 mmol/L (ref 98–111)
Creatinine, Ser: 1.49 mg/dL — ABNORMAL HIGH (ref 0.61–1.24)
GFR calc Af Amer: >60 mL/min (ref >60)
GFR calc non Af Amer: 56 mL/min — ABNORMAL LOW (ref >60)
Glucose, Bld: 112 mg/dL — ABNORMAL HIGH (ref 70–99)
Potassium: 3.6 mmol/L (ref 3.5–5.1)
Sodium: 138 mmol/L (ref 135–145)

## 2019-05-13 LAB — URINE CULTURE: Culture: >=100000 — AB

## 2019-05-13 LAB — GLUCOSE, CAPILLARY
Glucose-Capillary: 109 mg/dL — ABNORMAL HIGH (ref 70–99)
Glucose-Capillary: 104 mg/dL — ABNORMAL HIGH (ref 70–99)
Glucose-Capillary: 114 mg/dL — ABNORMAL HIGH (ref 70–99)
Glucose-Capillary: 112 mg/dL — ABNORMAL HIGH (ref 70–99)
Glucose-Capillary: 106 mg/dL — ABNORMAL HIGH (ref 70–99)

## 2019-05-13 LAB — MAGNESIUM: Magnesium: 1.9 mg/dL (ref 1.7–2.4)

## 2019-05-13 MED ORDER — POTASSIUM CHLORIDE CRYS ER 20 MEQ PO TBCR
40.0000 meq | EXTENDED_RELEASE_TABLET | Freq: Once | ORAL | Status: AC
  Administered 2019-05-13: 12:00:00 40 meq via ORAL
  Filled 2019-05-13: qty 2

## 2019-05-13 MED ORDER — SODIUM CHLORIDE 0.9 % IV SOLN
2.0000 g | INTRAVENOUS | Status: DC
  Administered 2019-05-13: 18:00:00 2 g via INTRAVENOUS
  Filled 2019-05-13: qty 2
  Filled 2019-05-13: qty 20

## 2019-05-13 MED ORDER — MAGNESIUM SULFATE 2 GM/50ML IV SOLN
2.0000 g | Freq: Once | INTRAVENOUS | Status: AC
  Administered 2019-05-13: 10:00:00 2 g via INTRAVENOUS
  Filled 2019-05-13: qty 50

## 2019-05-13 NOTE — Progress Notes (Signed)
PT Cancellation Note  Patient Details Name: Joshua Sampson MRN: [id number] DOB: 09/14/1975   Cancelled Treatment:    Reason Eval/Treat Not Completed: Medical issues which prohibited therapy(Chart reviewed; DBP and ECG HR remain outside of safe range to work with PT at this time. Will follow remotely and evaluate once medically ready.)  11:39 AM, 05/13/19 Etta Grandchild, PT, DPT Physical Therapist - [facility]  [phone] ([place])    Buccola,Allan C 05/13/2019, 11:39 AM

## 2019-05-13 NOTE — Progress Notes (Addendum)
PROGRESS NOTE    Joshua Sampson  UEA:[id number] DOB: [birth year] DOA: 05/06/2019 PCP: System, Pcp Not In    Brief Narrative:   Joshua Sampson is a 43 y.o. male with medical history significant for morbid obesity, hypertension and cluster headaches.  Patient states he is overall been doing well.  Through keto diet, he has lost 10 to 15 pounds lately intentionally.  He says in the last couple days has not been sleeping great, but overall he sleeps okay.  He is unsure if he snores, sleeps about 5 to 6 hours a night and says he does feel well rested.  Started having a cluster headache 3 days ago.  Intermittent, lasting about 10 minutes, but he had several repeatedly for the past few days including today so he came into the urgent care.  Prior to coming in, patient said he felt some mild chest palpitations and a little short of breath, but that quickly resolved.   43 y.o. 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  Interim History:  1. 11/5, pt developed hypotension with blood pressure 85/73 mm Hg and pulse rate 42 beats/min. PCCM was consulted.  Vasopressor, Neo was stared. Pt is off IV Neo 11/7. Bp 146 /98 today 2. He denies chest pain and abdominal pain this morning. 3. Still need BiPAP at night 4. UA negative on 11/5, but urine culture positive for Enterococcus FAECALIS and E. Coli 11/8. Since patient has been hypotensive, will start start Rocephin 11/8   Assessment & Plan:   Principal Problem:   Atrial flutter with rapid ventricular response (HCC) Active Problems:   Morbid obesity (HCC)   Hypertension   Headache   Acute renal failure superimposed on stage 2 chronic kidney disease (HCC)   Chest pain   Elevated troponin   Hypotension   Acute respiratory failure with hypoxia (HCC)   Acute  systolic CHF (congestive heart failure) (HCC)   UTI (urinary tract infection)  Hypotension: Patient's mental status is normal.  Etiology is not clear.  May be related to multiple hypertensive medications.  No fever or leukocytosis, low suspicion for sepsis.  PCCM was consulted.  Vasopressor, Neo was started. bp improved. Off IV neo 11/7   CXR showed cardiomegaly and vascular congestion without infiltration. Bp 146/91 in AM. U -pt is off Vasopressor, Neo -Blood cultures no grow so far -PT/OT ordered  Possible UTI: UA negative on 11/5, but urine culture positive for Enterococcus FAECALIS and E. Coli. Since patient has been hypotensive, which could be related UTI, therefore will start antibiotics. -will start Rocephin -blood culture no grow so far.  New onset atrial flutter with rapid ventricular response [facility]): card was consulted. CHA2DS2-VASc Score is 2, needs oral anticoagulation. Patient is started with Eliquis. ECHO with mildly reduced LV function and EF of 45-50%. - Eliquis 5 mg BID for anticoagulation  - hold diltiazem 240 mg daily and metoprolol 100 mg bid due to hypotension -on Amiodarone 200 mg bid orally   Acute respiratory failure with hypoxia due to new Systolic CHF: ECHO with mildly reduced LV function and EF of 45-50%. No respiratory distress -IV lasix 40 mg bid  -still need BiPAP -->cannot be transferred to 2A tele yet  Chest pain and troponin elevation:  Patient with mildly elevated HS-troponin to 63 initially, then 62 on repeat. Pt develop chest pain again  in later afternoon today.  Stat troponin 28. Had hypotension with SBP in upper 80s which improved to SBP>90s. -will get stat EKG - on eliquis,  -Card is on board   Morbid obesity: BMI 63.92 -Diet and exercise.   -Encouraged to lose weight.  -need outpatient sleep study  Hypertension: now has hypotension -hold Bp meds  CKD (chronic kidney disease), stage II: worsening. baseline Cre 1.2. his Cre is 2.39 -->1.75  -->1.49 -->1.49  and BUN 15. May be due to ATN secondary to hypotension and continuation of diuretics. -f/u by BMP  DVT prophylaxis: Eliquis Code Status: Full code Family Communication: wife at bedside Disposition Plan: Possible discharge in 1-2 days  barriers for discharge: still has A Fib with RVR with HR up to 130s.    Consultants:  card  Procedures:   Antimicrobials: Anti-infectives (From admission, onward)    Start     Dose/Rate Route Frequency Ordered Stop   05/13/19 1730  cefTRIAXone (ROCEPHIN) 2 g in sodium chloride 0.9 % 100 mL IVPB     2 g 200 mL/hr over 30 Minutes Intravenous Every 24 hours 05/13/19 1717          Subjective:  Patient states that his abdominal cramps have resolved.  Currently no abdominal pain, chest pain or shortness of breath.  No fever or chills.   Objective: Vitals:   05/13/19 1200 05/13/19 1400 05/13/19 1500 05/13/19 1600  BP:  (!) 122/104  (!) 152/138  Pulse:  (!) 53 96 62  Resp: 20 (!) 25    Temp:  97.6 F (36.4 C)    TempSrc:  Oral    SpO2:  96% (!) 89% 92%  Weight:      Height:        Intake/Output Summary (Last 24 hours) at 05/13/2019 1722 Last data filed at 05/13/2019 1417 Gross per 24 hour  Intake 486 ml  Output 4600 ml  Net -4114 ml   Filed Weights   05/11/19 0337 05/12/19 0348 05/13/19 0259  Weight: (!) 215.4 kg (!) 217 kg (!) 214.1 kg    Examination: Physical Exam:  General: Not in acute distress HEENT: PERRL, EOMI, no scleral icterus, No JVD or bruit Cardiac: S1/S2, RRR, No murmurs, gallops or rubs Pulm: Clear to auscultation bilaterally. No rales, wheezing, rhonchi or rubs. Abd: Soft, nondistended, nontender, no rebound pain, no organomegaly, BS present Ext: 1+ leg pitting edema bilaterally. 2+DP/PT pulse bilaterally Musculoskeletal: No joint deformities, erythema, or stiffness, ROM full Skin: No rashes.  Neuro: Alert and oriented X3, cranial nerves II-XII grossly intact. Psych: Patient is not psychotic,  no suicidal or hemocidal ideation.    Data Reviewed: I have personally reviewed following labs and imaging studies  CBC: Recent Labs  Lab 05/07/19 0657 05/08/19 0355 05/10/19 0208 05/11/19 0429 05/12/19 0421  WBC 5.2 5.2 6.5 5.4 4.6  NEUTROABS  --   --  4.3 2.9  --   HGB 15.5 15.6 14.9 14.2 14.5  HCT 47.3 48.0 46.4 45.1 45.9  MCV 89.8 90.9 92.1 93.4 92.4  PLT 205 206 243 188 176   Basic Metabolic Panel: Recent Labs  Lab 05/09/19 2105 05/10/19 0200 05/10/19 0208 05/10/19 1738 05/11/19 0429 05/12/19 0421 05/13/19 0426  NA 134*  --  135 137 136 138 138  K 3.6  --  4.6 4.3 3.7 3.6 3.6  CL 99  --  100 101 101 101 101  CO2 20*  --  GLUCOSE 165*  --  113* 100* 103* 101* 112*  BUN 25*  --  28* 30* 25* 17 15  CREATININE 2.41*  --  2.39* 2.10* 1.75* 1.49* 1.49*  CALCIUM 8.7*  --  8.8* 8.9 8.5* 8.8* 8.9  MG 2.2 2.2  --   --  2.0 1.8 1.9  PHOS 4.8*  --   --   --   --   --   --    GFR: Estimated Creatinine Clearance: 115.8 mL/min (A) (by C-G formula based on SCr of 1.49 mg/dL (H)). Liver Function Tests: Recent Labs  Lab 05/10/19 0208 05/10/19 1738 05/11/19 0429  AST 54* 38 39  ALT 99* 88* 85*  ALKPHOS 49 51 53  BILITOT 1.5* 1.4* 1.5*  PROT 7.1 6.9 6.7  ALBUMIN 3.7 3.4* 3.4*   No results for input(s): LIPASE, AMYLASE in the last 168 hours. No results for input(s): AMMONIA in the last 168 hours. Coagulation Profile: Recent Labs  Lab 05/06/19 1935  INR 1.3*   Cardiac Enzymes: No results for input(s): CKTOTAL, CKMB, CKMBINDEX, TROPONINI in the last 168 hours. BNP (last 3 results) No results for input(s): PROBNP in the last 8760 hours. HbA1C: No results for input(s): HGBA1C in the last 72 hours. CBG: Recent Labs  Lab 05/12/19 2345 05/13/19 0258 05/13/19 0736 05/13/19 1140 05/13/19 1601  GLUCAP 97 109* 104* 114* 112*   Lipid Profile: No results for input(s): CHOL, HDL, LDLCALC, TRIG, CHOLHDL, LDLDIRECT in the last 72 hours. Thyroid  Function Tests: No results for input(s): TSH, T4TOTAL, FREET4, T3FREE, THYROIDAB in the last 72 hours. Anemia Panel: No results for input(s): VITAMINB12, FOLATE, FERRITIN, TIBC, IRON, RETICCTPCT in the last 72 hours. Sepsis Labs: Recent Labs  Lab 05/09/19 2117 05/09/19 2317 05/10/19 0200 05/11/19 0429 05/12/19 0421  PROCALCITON <0.10  --   --  <0.10 <0.10  LATICACIDVEN  --  3.0* 2.0*  --  1.0    Recent Results (from the past 240 hour(s))  MRSA PCR Screening     Status: None   Collection Time: 05/06/19  2:19 PM   Specimen: Nasopharyngeal  Result Value Ref Range Status   MRSA by PCR NEGATIVE NEGATIVE Final    Comment:        The GeneXpert MRSA Assay (FDA approved for NASAL specimens only), is one component of a comprehensive MRSA colonization surveillance program. It is not intended to diagnose MRSA infection nor to guide or monitor treatment for MRSA infections. Performed at [facility], [address] [place], [place] [address]   SARS Coronavirus 2 by RT PCR (hospital order, performed in [facility] hospital lab) Nasopharyngeal Nasopharyngeal Swab     Status: None   Collection Time: 05/06/19  4:24 PM   Specimen: Nasopharyngeal Swab  Result Value Ref Range Status   SARS Coronavirus 2 NEGATIVE NEGATIVE Final    Comment: (NOTE) If result is NEGATIVE SARS-CoV-2 target nucleic acids are NOT DETECTED. The SARS-CoV-2 RNA is generally detectable in upper and lower  respiratory specimens during the acute phase of infection. The lowest  concentration of SARS-CoV-2 viral copies this assay can detect is 250  copies / mL. A negative result does not preclude SARS-CoV-2 infection  and should not be used as the sole basis for treatment or other  patient management decisions.  A negative result may occur with  improper specimen collection / handling, submission of specimen other  than nasopharyngeal swab, presence of viral mutation(s) within the  areas targeted by this  assay, and inadequate number of viral  copies  (<250 copies / mL). A negative result must be combined with clinical  observations, patient history, and epidemiological information. If result is POSITIVE SARS-CoV-2 target nucleic acids are DETECTED. The SARS-CoV-2 RNA is generally detectable in upper and lower  respiratory specimens dur ing the acute phase of infection.  Positive  results are indicative of active infection with SARS-CoV-2.  Clinical  correlation with patient history and other diagnostic information is  necessary to determine patient infection status.  Positive results do  not rule out bacterial infection or co-infection with other viruses. If result is PRESUMPTIVE POSTIVE SARS-CoV-2 nucleic acids MAY BE PRESENT.   A presumptive positive result was obtained on the submitted specimen  and confirmed on repeat testing.  While 2019 novel coronavirus  (SARS-CoV-2) nucleic acids may be present in the submitted sample  additional confirmatory testing may be necessary for epidemiological  and / or clinical management purposes  to differentiate between  SARS-CoV-2 and other Sarbecovirus currently known to infect humans.  If clinically indicated additional testing with an alternate test  methodology [phone]([id number]) is advised. The SARS-CoV-2 RNA is generally  detectable in upper and lower respiratory sp ecimens during the acute  phase of infection. The expected result is Negative. Fact Sheet for Patients:  BoilerBrush.com.cyhttps://www.fda.gov/media/136312/download Fact Sheet for Healthcare Providers: https://pope.com/https://www.fda.gov/media/136313/download This test is not yet approved or cleared by the [place] FDA and has been authorized for detection and/or diagnosis of SARS-CoV-2 by FDA under an Emergency Use Authorization (EUA).  This EUA will remain in effect (meaning this test can be used) for the duration of the COVID-19 declaration under Section 564(b)(1) of the Act, 21 U.S.C. section 360bbb-3(b)(1),  unless the authorization is terminated or revoked sooner. Performed at [facility], [address] [place], [place] [address]   CULTURE, BLOOD (ROUTINE X 2) w Reflex to ID Panel     Status: None (Preliminary result)   Collection Time: 05/10/19 11:28 AM   Specimen: BLOOD  Result Value Ref Range Status   Specimen Description BLOOD BLOOD RIGHT HAND  Final   Special Requests   Final    BOTTLES DRAWN AEROBIC AND ANAEROBIC Blood Culture adequate volume   Culture   Final    NO GROWTH 3 DAYS Performed at [facility], [address] [place], [place] [address]    Report Status PENDING  Incomplete  CULTURE, BLOOD (ROUTINE X 2) w Reflex to ID Panel     Status: None (Preliminary result)   Collection Time: 05/10/19  1:53 PM   Specimen: BLOOD  Result Value Ref Range Status   Specimen Description BLOOD BLOOD RIGHT HAND  Final   Special Requests   Final    BOTTLES DRAWN AEROBIC AND ANAEROBIC Blood Culture adequate volume   Culture   Final    NO GROWTH 3 DAYS Performed at [facility], [address] [place], [place] [address]    Report Status PENDING  Incomplete  Urine Culture     Status: Abnormal   Collection Time: 05/10/19  7:27 PM   Specimen: Urine, Random  Result Value Ref Range Status   Specimen Description   Final    URINE, RANDOM Performed at [facility], [address] [place], [place] [address]    Special Requests   Final    NONE Performed at [facility], [address] [place], [place] [address]    Culture (A)  Final    >=100,000 COLONIES/mL ESCHERICHIA COLI >=100,000 COLONIES/mL ENTEROCOCCUS FAECALIS  Report Status 05/13/2019 FINAL  Final   Organism ID, Bacteria ENTEROCOCCUS FAECALIS (A)  Final   Organism ID, Bacteria ESCHERICHIA COLI (A)  Final      Susceptibility   Escherichia coli - MIC*    AMPICILLIN <=2 SENSITIVE Sensitive     CEFAZOLIN <=4 SENSITIVE Sensitive     CEFTRIAXONE <=1 SENSITIVE Sensitive      CIPROFLOXACIN <=0.25 SENSITIVE Sensitive     GENTAMICIN <=1 SENSITIVE Sensitive     IMIPENEM <=0.25 SENSITIVE Sensitive     NITROFURANTOIN <=16 SENSITIVE Sensitive     TRIMETH/SULFA <=20 SENSITIVE Sensitive     AMPICILLIN/SULBACTAM <=2 SENSITIVE Sensitive     PIP/TAZO <=4 SENSITIVE Sensitive     Extended ESBL NEGATIVE Sensitive     * >=100,000 COLONIES/mL ESCHERICHIA COLI   Enterococcus faecalis - MIC*    AMPICILLIN <=2 SENSITIVE Sensitive     LEVOFLOXACIN 1 SENSITIVE Sensitive     NITROFURANTOIN <=16 SENSITIVE Sensitive     VANCOMYCIN 1 SENSITIVE Sensitive     * >=100,000 COLONIES/mL ENTEROCOCCUS FAECALIS      Radiology Studies: No results found.      Scheduled Meds:  alum & mag hydroxide-simeth  30 mL Oral Once   And   lidocaine  15 mL Oral Once   amiodarone  200 mg Oral BID   apixaban  5 mg Oral BID   Chlorhexidine Gluconate Cloth  6 each Topical Daily   mouth rinse  15 mL Mouth Rinse BID   pantoprazole (PROTONIX) IV  40 mg Intravenous Q24H   polyethylene glycol  17 g Oral Daily   senna-docusate  1 tablet Oral BID   sodium chloride flush  3 mL Intravenous Q12H   Continuous Infusions:  sodium chloride 250 mL (05/10/19 1145)   cefTRIAXone (ROCEPHIN)  IV       LOS: 7 days    Time spent: 30 min    Ivor Costa, DO [facility] PAGER is on [place]  If 7PM-7AM, please contact night-coverage [email or web address] Password [email or web address] 05/13/2019, 5:22 PM

## 2019-05-13 NOTE — Progress Notes (Signed)
Pharmacy Electrolyte Monitoring Consult:  Pharmacy consulted to assist in monitoring and replacing electrolytes in this 43 y.o. male admitted on 05/06/2019 with Headache, Tachycardia, and Shortness of Breath   Labs:  Sodium (mmol/L)  Date Value  05/13/2019 138   Potassium (mmol/L)  Date Value  05/13/2019 3.6   Magnesium (mg/dL)  Date Value  05/13/2019 1.9   Phosphorus (mg/dL)  Date Value  05/09/2019 4.8 (H)   Calcium (mg/dL)  Date Value  05/13/2019 8.9   Albumin (g/dL)  Date Value  05/11/2019 3.4 (L)    Assessment/Plan: Pt is on NS infusion running at 10-20 mL/hr, lasix 40 mg BID IV x 4 doses. K+ 3.6 and Mg 1.9.   Will give Potassium 68mEq x 1 PO.   Will obtain BMP with am labs.   Will replace for goal potassium ~ 4 and goal magnesium ~ 2.   Pharmacy will continue to monitor and adjust per consult.   Oswald Hillock, PharmD, BCPS 05/13/2019 10:04 AM

## 2019-05-13 NOTE — Progress Notes (Signed)
[facility] Cardiology  SUBJECTIVE: Patient sitting in bed, denies chest pain or shortness of breath   Vitals:   05/13/19 0259 05/13/19 0700 05/13/19 0800 05/13/19 0900  BP:      Pulse:  (!) 41 (!) 108 (!) 129  Resp:  17 (!) 21 18  Temp:   97.6 F (36.4 C)   TempSrc:   Oral   SpO2:  98% 90% 91%  Weight: (!) 214.1 kg     Height:         Intake/Output Summary (Last 24 hours) at 05/13/2019 1009 Last data filed at 05/13/2019 1610 Gross per 24 hour  Intake 246 ml  Output 6175 ml  Net -5929 ml      PHYSICAL EXAM  General: Well developed, well nourished, in no acute distress HEENT:  Normocephalic and atramatic Neck:  No JVD.  Lungs: Clear bilaterally to auscultation and percussion. Heart: HRRR . Normal S1 and S2 without gallops or murmurs.  Abdomen: Bowel sounds are positive, abdomen soft and non-tender  Msk:  Back normal, normal gait. Normal strength and tone for age. Extremities: No clubbing, cyanosis or edema.   Neuro: Alert and oriented X 3. Psych:  Good affect, responds appropriately   LABS: Basic Metabolic Panel: Recent Labs    05/12/19 0421 05/13/19 0426  NA 138 138  K 3.6 3.6  CL 101 101  CO2 28 28  GLUCOSE 101* 112*  BUN 17 15  CREATININE 1.49* 1.49*  CALCIUM 8.8* 8.9  MG 1.8 1.9   Liver Function Tests: Recent Labs    05/10/19 1738 05/11/19 0429  AST 38 39  ALT 88* 85*  ALKPHOS 51 53  BILITOT 1.4* 1.5*  PROT 6.9 6.7  ALBUMIN 3.4* 3.4*   No results for input(s): LIPASE, AMYLASE in the last 72 hours. CBC: Recent Labs    05/11/19 0429 05/12/19 0421  WBC 5.4 4.6  NEUTROABS 2.9  --   HGB 14.2 14.5  HCT 45.1 45.9  MCV 93.4 92.4  PLT 188 176   Cardiac Enzymes: No results for input(s): CKTOTAL, CKMB, CKMBINDEX, TROPONINI in the last 72 hours. BNP: Invalid input(s): POCBNP D-Dimer: No results for input(s): DDIMER in the last 72 hours. Hemoglobin A1C: No results for input(s): HGBA1C in the last 72 hours. Fasting Lipid Panel: No results for  input(s): CHOL, HDL, LDLCALC, TRIG, CHOLHDL, LDLDIRECT in the last 72 hours. Thyroid Function Tests: No results for input(s): TSH, T4TOTAL, T3FREE, THYROIDAB in the last 72 hours.  Invalid input(s): FREET3 Anemia Panel: No results for input(s): VITAMINB12, FOLATE, FERRITIN, TIBC, IRON, RETICCTPCT in the last 72 hours.  No results found.   Echo LVEF 45 to 50%  TELEMETRY: Atrial fibrillation, 110 to 130 bpm:  ASSESSMENT AND PLAN:  Principal Problem:   Atrial flutter with rapid ventricular response (HCC) Active Problems:   Morbid obesity (HCC)   Hypertension   Headache   Acute renal failure superimposed on stage 2 chronic kidney disease (HCC)   Chest pain   Elevated troponin   Hypotension   Acute respiratory failure with hypoxia (HCC)   Acute systolic CHF (congestive heart failure) (HCC)    1.  New onset atrial fibrillation with RVR, initially on metoprolol 100 mg twice daily Cardizem 240mg  daily, discontinued due to hypotension and bradycardia.  Patient started on amiodarone 200 mg twice daily 05/11/2019 for rate and rhythm control.  Also started on Eliquis 5 mg twice daily for stroke prevention. 2.  Hypotension, resolved, off of pressors 3.  Acute diastolic CHF,  LVEF 45 to 50%, improved after diuresis 4.  Morbid obesity/obstructive sleep apnea 5.  Chest pain, atypical, borderline elevated high-sensitivity troponin ( 31,35 ) , likely demand supply ischemia, not due to acute coronary syndrome  Recommendations  1.  Agree with overall current therapy 2.  Continue amiodarone 200 mg twice daily for rate and rhythm control 3.  Continue Eliquis 5 mg twice daily for stroke prevention 4.  May transfer to 2A telemetry   Isaias Cowman, MD, PhD, [facility] 05/13/2019 10:09 AM

## 2019-05-14 DIAGNOSIS — I5021 Acute systolic (congestive) heart failure: Secondary | ICD-10-CM

## 2019-05-14 LAB — BASIC METABOLIC PANEL
Anion gap: 10 (ref 5–15)
BUN: 13 mg/dL (ref 6–20)
CO2: 28 mmol/L (ref 22–32)
Calcium: 8.9 mg/dL (ref 8.9–10.3)
Chloride: 98 mmol/L (ref 98–111)
Creatinine, Ser: 1.49 mg/dL — ABNORMAL HIGH (ref 0.61–1.24)
GFR calc Af Amer: >60 mL/min (ref >60)
GFR calc non Af Amer: 56 mL/min — ABNORMAL LOW (ref >60)
Glucose, Bld: 111 mg/dL — ABNORMAL HIGH (ref 70–99)
Potassium: 4.1 mmol/L (ref 3.5–5.1)
Sodium: 136 mmol/L (ref 135–145)

## 2019-05-14 LAB — CBC
HCT: 48.3 % (ref 39.0–52.0)
Hemoglobin: 15.5 g/dL (ref 13.0–17.0)
MCH: 29.2 pg (ref 26.0–34.0)
MCHC: 32.1 g/dL (ref 30.0–36.0)
MCV: 91 fL (ref 80.0–100.0)
Platelets: 194 10*3/uL (ref 150–400)
RBC: 5.31 MIL/uL (ref 4.22–5.81)
RDW: 12 % (ref 11.5–15.5)
WBC: 4.3 10*3/uL (ref 4.0–10.5)
nRBC: 0 % (ref 0.0–0.2)

## 2019-05-14 LAB — GLUCOSE, CAPILLARY
Glucose-Capillary: 104 mg/dL — ABNORMAL HIGH (ref 70–99)
Glucose-Capillary: 114 mg/dL — ABNORMAL HIGH (ref 70–99)
Glucose-Capillary: 91 mg/dL (ref 70–99)

## 2019-05-14 LAB — BRAIN NATRIURETIC PEPTIDE: B Natriuretic Peptide: 271 pg/mL — ABNORMAL HIGH (ref 0.0–100.0)

## 2019-05-14 LAB — MAGNESIUM: Magnesium: 2.2 mg/dL (ref 1.7–2.4)

## 2019-05-14 MED ORDER — DILTIAZEM HCL ER COATED BEADS 180 MG PO CP24
180.0000 mg | ORAL_CAPSULE | Freq: Every day | ORAL | Status: DC
  Administered 2019-05-15 – 2019-05-16 (×2): 180 mg via ORAL
  Filled 2019-05-14 (×2): qty 1

## 2019-05-14 MED ORDER — AMOXICILLIN 500 MG PO CAPS
500.0000 mg | ORAL_CAPSULE | Freq: Three times a day (TID) | ORAL | Status: DC
  Administered 2019-05-14 – 2019-05-16 (×6): 500 mg via ORAL
  Filled 2019-05-14 (×8): qty 1

## 2019-05-14 MED ORDER — DILTIAZEM HCL ER COATED BEADS 120 MG PO CP24
120.0000 mg | ORAL_CAPSULE | Freq: Every day | ORAL | Status: DC
  Administered 2019-05-14: 120 mg via ORAL
  Filled 2019-05-14: qty 1

## 2019-05-14 NOTE — Progress Notes (Addendum)
PROGRESS NOTE    Joshua Sampson  WUJ:[id number] DOB: 25-Mar-1976 DOA: 05/06/2019 PCP: System, Pcp Not In    Brief Narrative:   Joshua Sampson is a 43 y.o. male with medical history significant for morbid obesity, hypertension and cluster headaches.  Patient states he is overall been doing well.  Through keto diet, he has lost 10 to 15 pounds lately intentionally.  He says in the last couple days has not been sleeping great, but overall he sleeps okay.  He is unsure if he snores, sleeps about 5 to 6 hours a night and says he does feel well rested.  Started having a cluster headache 3 days ago.  Intermittent, lasting about 10 minutes, but he had several repeatedly for the past few days including today so he came into the urgent care.  Prior to coming in, patient said he felt some mild chest palpitations and a little short of breath, but that quickly resolved.   43 y.o. 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  Interim History:  1. 11/5, pt developed hypotension with blood pressure 85/73 mm Hg and pulse rate 42 beats/min. PCCM was consulted.  Vasopressor, Neo was stared. Pt is off IV Neo 11/7. Bp 146 /98 today 2. He denies chest pain and abdominal pain this morning. 3. Still need BiPAP at night 4. UA negative on 11/5, but urine culture positive for Enterococcus FAECALIS and E. Coli 11/8. Since patient has been hypotensive, will start start Rocephin 11/8 -->changed to Amoxicillin per pharm 5. 11/9 Transferred to tele 2A, still needs BiPAP in night 6. 11/9 card added Cardizem CD 180 mg once daily    Assessment & Plan:   Principal Problem:   Atrial flutter with rapid ventricular response (HCC) Active Problems:   Morbid obesity (HCC)   Hypertension   Headache   Acute renal failure superimposed on  stage 2 chronic kidney disease (HCC)   Chest pain   Elevated troponin   Hypotension   Acute respiratory failure with hypoxia (HCC)   Acute systolic CHF (congestive heart failure) (HCC)   UTI (urinary tract infection)   Acute on chronic respiratory failure (HCC)  Hypotension: Patient's mental status is normal.  Etiology is not clear.  May be related to multiple hypertensive medications.  No fever or leukocytosis, low suspicion for sepsis.  PCCM was consulted.  Vasopressor, Neo was started. bp improved. Off IV neo 11/7   CXR showed cardiomegaly and vascular congestion without infiltration. Bp 152/123 in AM.  -pt is off Vasopressor, Neo -Blood cultures no grow so far -PT/OT  Possible UTI: UA negative on 11/5, but urine culture positive for Enterococcus FAECALIS and E. Coli. Since patient has been hypotensive, which could be related UTI, therefore will start antibiotics. -will start Rocephin -->changed to amoxicillin 11/9 per pharm -blood culture no grow so far.  New onset atrial flutter with rapid ventricular response [facility]): card was consulted. CHA2DS2-VASc Score is 2, needs oral anticoagulation. Patient is started with Eliquis. ECHO with mildly reduced LV function and EF of 45-50%. - Eliquis 5 mg BID for anticoagulation  - hold diltiazem 240 mg daily and metoprolol 100 mg bid due to hypotension -->added 180 mg of cardizem by Card 11/9 -on Amiodarone 200 mg bid orally   Acute respiratory failure with hypoxia due to new Systolic CHF: ECHO with mildly reduced LV  function and EF of 45-50%. No respiratory distress -IV lasix 40 mg bid  -still need BiPAP in night  Chest pain and troponin elevation:  Patient with mildly elevated HS-troponin to 63 initially, then 62 on repeat. Pt develop chest pain again in later afternoon today.  Stat troponin 28. Had hypotension with SBP in upper 80s which improved to SBP>90s. -will get stat EKG - on eliquis,  -Card is on board   Morbid obesity: BMI  63.92 -Diet and exercise.   -Encouraged to lose weight.  -need outpatient sleep study  Hypertension: now has hypotension -hold Bp meds  CKD (chronic kidney disease), stage II: worsening. baseline Cre 1.2. his Cre is 2.39 -->1.75 -->1.49 -->1.49 -->1.49 and BUN 15. May be due to ATN secondary to hypotension and continuation of diuretics. -f/u by BMP  DVT prophylaxis: Eliquis Code Status: Full code Family Communication: not today Disposition Plan: Possible discharge in 1-2 days  barriers for discharge: still needs BiPAP.    Consultants:   card  Procedures:    Antimicrobials: Anti-infectives (From admission, onward)   Start     Dose/Rate Route Frequency Ordered Stop   05/14/19 1400  amoxicillin (AMOXIL) capsule 500 mg     500 mg Oral Every 8 hours 05/14/19 1253     05/13/19 1800  cefTRIAXone (ROCEPHIN) 2 g in sodium chloride 0.9 % 100 mL IVPB  Status:  Discontinued     2 g 200 mL/hr over 30 Minutes Intravenous Every 24 hours 05/13/19 1717 05/14/19 1253       Subjective:  Patient states he has no CP, SOB, AP, fever or chills.    Objective: Vitals:   05/14/19 1000 05/14/19 1156 05/14/19 1634 05/14/19 2013  BP: (!) 121/111 (!) 129/96 (!) 142/122 (!) 128/107  Pulse: 92 (!) 130 (!) 115 (!) 55  Resp: (!) 21 19 20 19   Temp:  98.2 F (36.8 C) 98.4 F (36.9 C) 98.5 F (36.9 C)  TempSrc:    Oral  SpO2: 94% 96% 96% 97%  Weight:      Height:        Intake/Output Summary (Last 24 hours) at 05/14/2019 2236 Last data filed at 05/14/2019 2129 Gross per 24 hour  Intake 316.07 ml  Output 1075 ml  Net -758.93 ml   Filed Weights   05/12/19 0348 05/13/19 0259 05/14/19 0500  Weight: (!) 217 kg (!) 214.1 kg (!) 213.7 kg    Examination: Physical Exam:  General: Not in acute distress HEENT: PERRL, EOMI, no scleral icterus, No JVD or bruit Cardiac: S1/S2, RRR, No murmurs, gallops or rubs Pulm: Clear to auscultation bilaterally. No rales, wheezing, rhonchi or rubs. Abd:  Soft, nondistended, nontender, no rebound pain, no organomegaly, BS present Ext: 1+ leg pitting edema bilaterally. 2+DP/PT pulse bilaterally Musculoskeletal: No joint deformities, erythema, or stiffness, ROM full Skin: No rashes.  Neuro: Alert and oriented X3, cranial nerves II-XII grossly intact. Psych: Patient is not psychotic, no suicidal or hemocidal ideation.    Data Reviewed: I have personally reviewed following labs and imaging studies  CBC: Recent Labs  Lab 05/08/19 0355 05/10/19 0208 05/11/19 0429 05/12/19 0421 05/14/19 0601  WBC 5.2 6.5 5.4 4.6 4.3  NEUTROABS  --  4.3 2.9  --   --   HGB 15.6 14.9 14.2 14.5 15.5  HCT 48.0 46.4 45.1 45.9 48.3  MCV 90.9 92.1 93.4 92.4 91.0  PLT 206 243 188 176 735   Basic Metabolic Panel: Recent Labs  Lab 05/09/19 2105 05/10/19 0200  05/10/19 1738 05/11/19 0429 05/12/19 0421 05/13/19 0426 05/14/19 0601  NA 134*  --    < > 137 136 138 138 136  K 3.6  --    < > 4.3 3.7 3.6 3.6 4.1  CL 99  --    < > 101 101 101 101 98  CO2 20*  --    < > 24 26 28 28 28   GLUCOSE 165*  --    < > 100* 103* 101* 112* 111*  BUN 25*  --    < > 30* 25* 17 15 13   CREATININE 2.41*  --    < > 2.10* 1.75* 1.49* 1.49* 1.49*  CALCIUM 8.7*  --    < > 8.9 8.5* 8.8* 8.9 8.9  MG 2.2 2.2  --   --  2.0 1.8 1.9 2.2  PHOS 4.8*  --   --   --   --   --   --   --    < > = values in this interval not displayed.   GFR: Estimated Creatinine Clearance: 115.7 mL/min (A) (by C-G formula based on SCr of 1.49 mg/dL (H)). Liver Function Tests: Recent Labs  Lab 05/10/19 0208 05/10/19 1738 05/11/19 0429  AST 54* 38 39  ALT 99* 88* 85*  ALKPHOS 49 51 53  BILITOT 1.5* 1.4* 1.5*  PROT 7.1 6.9 6.7  ALBUMIN 3.7 3.4* 3.4*   No results for input(s): LIPASE, AMYLASE in the last 168 hours. No results for input(s): AMMONIA in the last 168 hours. Coagulation Profile: No results for input(s): INR, PROTIME in the last 168 hours. Cardiac Enzymes: No results for input(s):  CKTOTAL, CKMB, CKMBINDEX, TROPONINI in the last 168 hours. BNP (last 3 results) No results for input(s): PROBNP in the last 8760 hours. HbA1C: No results for input(s): HGBA1C in the last 72 hours. CBG: Recent Labs  Lab 05/13/19 1601 05/13/19 1933 05/14/19 0010 05/14/19 0404 05/14/19 0745  GLUCAP 112* 106* 114* 104* 91   Lipid Profile: No results for input(s): CHOL, HDL, LDLCALC, TRIG, CHOLHDL, LDLDIRECT in the last 72 hours. Thyroid Function Tests: No results for input(s): TSH, T4TOTAL, FREET4, T3FREE, THYROIDAB in the last 72 hours. Anemia Panel: No results for input(s): VITAMINB12, FOLATE, FERRITIN, TIBC, IRON, RETICCTPCT in the last 72 hours. Sepsis Labs: Recent Labs  Lab 05/09/19 2117 05/09/19 2317 05/10/19 0200 05/11/19 0429 05/12/19 0421  PROCALCITON <0.10  --   --  <0.10 <0.10  LATICACIDVEN  --  3.0* 2.0*  --  1.0    Recent Results (from the past 240 hour(s))  MRSA PCR Screening     Status: None   Collection Time: 05/06/19  2:19 PM   Specimen: Nasopharyngeal  Result Value Ref Range Status   MRSA by PCR NEGATIVE NEGATIVE Final    Comment:        The GeneXpert MRSA Assay (FDA approved for NASAL specimens only), is one component of a comprehensive MRSA colonization surveillance program. It is not intended to diagnose MRSA infection nor to guide or monitor treatment for MRSA infections. Performed at [facility], [address] [place], [place] [address]   SARS Coronavirus 2 by RT PCR (hospital order, performed in [facility] hospital lab) Nasopharyngeal Nasopharyngeal Swab     Status: None   Collection Time: 05/06/19  4:24 PM   Specimen: Nasopharyngeal Swab  Result Value Ref Range Status   SARS Coronavirus 2 NEGATIVE NEGATIVE Final    Comment: (NOTE) If result is NEGATIVE SARS-CoV-2 target  nucleic acids are NOT DETECTED. The SARS-CoV-2 RNA is generally detectable in upper and lower  respiratory specimens during the acute phase of infection.  The lowest  concentration of SARS-CoV-2 viral copies this assay can detect is 250  copies / mL. A negative result does not preclude SARS-CoV-2 infection  and should not be used as the sole basis for treatment or other  patient management decisions.  A negative result may occur with  improper specimen collection / handling, submission of specimen other  than nasopharyngeal swab, presence of viral mutation(s) within the  areas targeted by this assay, and inadequate number of viral copies  (<250 copies / mL). A negative result must be combined with clinical  observations, patient history, and epidemiological information. If result is POSITIVE SARS-CoV-2 target nucleic acids are DETECTED. The SARS-CoV-2 RNA is generally detectable in upper and lower  respiratory specimens dur ing the acute phase of infection.  Positive  results are indicative of active infection with SARS-CoV-2.  Clinical  correlation with patient history and other diagnostic information is  necessary to determine patient infection status.  Positive results do  not rule out bacterial infection or co-infection with other viruses. If result is PRESUMPTIVE POSTIVE SARS-CoV-2 nucleic acids MAY BE PRESENT.   A presumptive positive result was obtained on the submitted specimen  and confirmed on repeat testing.  While 2019 novel coronavirus  (SARS-CoV-2) nucleic acids may be present in the submitted sample  additional confirmatory testing may be necessary for epidemiological  and / or clinical management purposes  to differentiate between  SARS-CoV-2 and other Sarbecovirus currently known to infect humans.  If clinically indicated additional testing with an alternate test  methodology [phone]) is advised. The SARS-CoV-2 RNA is generally  detectable in upper and lower respiratory sp ecimens during the acute  phase of infection. The expected result is Negative. Fact Sheet for Patients:   BoilerBrush.com.cy Fact Sheet for Healthcare Providers: https://pope.com/ This test is not yet approved or cleared by the [place] FDA and has been authorized for detection and/or diagnosis of SARS-CoV-2 by FDA under an Emergency Use Authorization (EUA).  This EUA will remain in effect (meaning this test can be used) for the duration of the COVID-19 declaration under Section 564(b)(1) of the Act, 21 U.S.C. section 360bbb-3(b)(1), unless the authorization is terminated or revoked sooner. Performed at [facility], [address] [place], [place] [address]   CULTURE, BLOOD (ROUTINE X 2) w Reflex to ID Panel     Status: None (Preliminary result)   Collection Time: 05/10/19 11:28 AM   Specimen: BLOOD  Result Value Ref Range Status   Specimen Description BLOOD BLOOD RIGHT HAND  Final   Special Requests   Final    BOTTLES DRAWN AEROBIC AND ANAEROBIC Blood Culture adequate volume   Culture   Final    NO GROWTH 4 DAYS Performed at [facility], [address], [place], [place] [address]    Report Status PENDING  Incomplete  CULTURE, BLOOD (ROUTINE X 2) w Reflex to ID Panel     Status: None (Preliminary result)   Collection Time: 05/10/19  1:53 PM   Specimen: BLOOD  Result Value Ref Range Status   Specimen Description BLOOD BLOOD RIGHT HAND  Final   Special Requests   Final    BOTTLES DRAWN AEROBIC AND ANAEROBIC Blood Culture adequate volume   Culture   Final    NO GROWTH 4 DAYS Performed at [facility], [address]  [place], [place] [address]    Report Status PENDING  Incomplete  Urine Culture     Status: Abnormal   Collection Time: 05/10/19  7:27 PM   Specimen: Urine, Random  Result Value Ref Range Status   Specimen Description   Final    URINE, RANDOM Performed at [facility], [address] [place], [place] [address]    Special Requests   Final    NONE Performed at [facility], [address] [place], [place] [address]    Culture (A)  Final    >=100,000 COLONIES/mL ESCHERICHIA COLI >=100,000 COLONIES/mL ENTEROCOCCUS FAECALIS    Report Status 05/13/2019 FINAL  Final   Organism ID, Bacteria ENTEROCOCCUS FAECALIS (A)  Final   Organism ID, Bacteria ESCHERICHIA COLI (A)  Final      Susceptibility   Escherichia coli - MIC*    AMPICILLIN <=2 SENSITIVE Sensitive     CEFAZOLIN <=4 SENSITIVE Sensitive     CEFTRIAXONE <=1 SENSITIVE Sensitive     CIPROFLOXACIN <=0.25 SENSITIVE Sensitive     GENTAMICIN <=1 SENSITIVE Sensitive     IMIPENEM <=0.25 SENSITIVE Sensitive     NITROFURANTOIN <=16 SENSITIVE Sensitive     TRIMETH/SULFA <=20 SENSITIVE Sensitive     AMPICILLIN/SULBACTAM <=2 SENSITIVE Sensitive     PIP/TAZO <=4 SENSITIVE Sensitive     Extended ESBL NEGATIVE Sensitive     * >=100,000 COLONIES/mL ESCHERICHIA COLI   Enterococcus faecalis - MIC*    AMPICILLIN <=2 SENSITIVE Sensitive     LEVOFLOXACIN 1 SENSITIVE Sensitive     NITROFURANTOIN <=16 SENSITIVE Sensitive     VANCOMYCIN 1 SENSITIVE Sensitive     * >=100,000 COLONIES/mL ENTEROCOCCUS FAECALIS      Radiology Studies: No results found.      Scheduled Meds:  alum & mag hydroxide-simeth  30 mL Oral Once   And   lidocaine  15 mL Oral Once   amiodarone  200 mg Oral BID   amoxicillin  500 mg Oral Q8H   apixaban  5 mg Oral BID   Chlorhexidine Gluconate Cloth  6 each Topical Daily   [START ON 05/15/2019] diltiazem  180 mg Oral Daily   mouth rinse  15 mL Mouth Rinse BID   pantoprazole (PROTONIX) IV  40 mg Intravenous Q24H   sodium chloride flush  3 mL Intravenous Q12H   Continuous Infusions:  sodium chloride 250 mL (05/10/19 1145)     LOS: 8 days    Time spent: 30 min    Lorretta Harp, DO [facility] PAGER is on AMION  If 7PM-7AM, please contact night-coverage [email or web address] Password [facility] 05/14/2019, 10:36 PM

## 2019-05-14 NOTE — Progress Notes (Signed)
Pharmacy Electrolyte Monitoring Consult:  Pharmacy consulted to assist in monitoring and replacing electrolytes in this 43 y.o. male admitted on 05/06/2019 with Headache, Tachycardia, and Shortness of Breath   Labs:  Sodium (mmol/L)  Date Value  05/14/2019 136   Potassium (mmol/L)  Date Value  05/14/2019 4.1   Magnesium (mg/dL)  Date Value  05/14/2019 2.2   Phosphorus (mg/dL)  Date Value  05/09/2019 4.8 (H)   Calcium (mg/dL)  Date Value  05/14/2019 8.9   Albumin (g/dL)  Date Value  05/11/2019 3.4 (L)    Assessment/Plan: 11/8 Pt is on NS infusion running at 10-20 mL/hr, lasix 40 mg BID IV x 4 doses. K+ 3.6 and Mg 1.9.   11/9 lasix stopped, K 4.1, Mag 2.2 Pt out of ICU at this time Will sign off   Rocky Morel, PharmD, BCPS 05/14/2019 12:36 PM

## 2019-05-14 NOTE — Progress Notes (Signed)
PT Cancellation Note  Patient Details Name: Joshua Sampson MRN: [id number] DOB: [birth year]   Cancelled Treatment:    Reason Eval/Treat Not Completed: Medical issues which prohibited therapy; Chart reviewed; BP and ECG HR remain outside of safe range to work with PT at this time. Will follow remotely and evaluate once medically ready.   Heloise Beecham Belford Pascucci PT, DPT 05/14/19, 9:30 AM

## 2019-05-14 NOTE — Progress Notes (Signed)
[facility] Card[facility]  SUBJECTIVE: The patient reports feeling better this morning. He denies chest pain, shortness of breath, or prolonged palpitations. He has no complaints this morning.   Vitals:   05/14/19 0500 05/14/19 0700 05/14/19 0800 05/14/19 0900  BP:   (!) 152/123   Pulse:  (!) 38 (!) 54 (!) 54  Resp:  (!) 23 (!) 26 (!) 25  Temp:   98.3 F (36.8 C)   TempSrc:   Oral   SpO2:  92% 91% 96%  Weight: (!) 213.7 kg     Height:         Intake/Output Summary (Last 24 hours) at 05/14/2019 5732 Last data filed at 05/14/2019 0932 Gross per 24 hour  Intake 703.07 ml  Output 2125 ml  Net -1421.93 ml      PHYSICAL EXAM  General: Morbidly obese gentleman sitting up in bed working on his computer, in no acute distress HEENT:  Normocephalic and atramatic Neck:  No JVD.  Lungs: normal effort of breathing on room air, no wheezing Heart: irregularly irregular without gallops or murmurs.  Abdomen: no obvious distention Msk:  Gait not assessed. No obvious deformity. Normal strength and tone for age. Extremities: 1-2+ bilateral lower extremity edema.   Neuro: Alert and oriented X 3. Psych:  Good affect, responds appropriately   LABS: Basic Metabolic Panel: Recent Labs    05/13/19 0426 05/14/19 0601  NA 138 136  K 3.6 4.1  CL 101 98  CO2 28 28  GLUCOSE 112* 111*  BUN 15 13  CREATININE 1.49* 1.49*  CALCIUM 8.9 8.9  MG 1.9 2.2   Liver Function Tests: No results for input(s): AST, ALT, ALKPHOS, BILITOT, PROT, ALBUMIN in the last 72 hours. No results for input(s): LIPASE, AMYLASE in the last 72 hours. CBC: Recent Labs    05/12/19 0421 05/14/19 0601  WBC 4.6 4.3  HGB 14.5 15.5  HCT 45.9 48.3  MCV 92.4 91.0  PLT 176 194   Cardiac Enzymes: No results for input(s): CKTOTAL, CKMB, CKMBINDEX, TROPONINI in the last 72 hours. BNP: Invalid input(s): POCBNP D-Dimer: No results for input(s): DDIMER in the last 72 hours. Hemoglobin A1C: No results for input(s): HGBA1C in the  last 72 hours. Fasting Lipid Panel: No results for input(s): CHOL, HDL, LDLCALC, TRIG, CHOLHDL, LDLDIRECT in the last 72 hours. Thyroid Function Tests: No results for input(s): TSH, T4TOTAL, T3FREE, THYROIDAB in the last 72 hours.  Invalid input(s): FREET3 Anemia Panel: No results for input(s): VITAMINB12, FOLATE, FERRITIN, TIBC, IRON, RETICCTPCT in the last 72 hours.  No results found.   Echo LVEF 45-50%  TELEMETRY: atrial fibrillation with rates 130-150 bpm  ASSESSMENT AND PLAN:  Principal Problem:   Atrial flutter with rapid ventricular response (HCC) Active Problems:   Morbid obesity (HCC)   Hypertension   Headache   Acute renal failure superimposed on stage 2 chronic kidney disease (HCC)   Chest pain   Elevated troponin   Hypotension   Acute respiratory failure with hypoxia (HCC)   Acute systolic CHF (congestive heart failure) (HCC)   UTI (urinary tract infection)   Acute on chronic respiratory failure (HCC)    1. New onset atrial fibrillation with RVR, initially on metoprolol 100 mg twice daily Cardizem 240mg  daily, discontinued due to hypotension and bradycardia.  Patient started on amiodarone 200 mg twice daily. Heart rate still poorly controlled with consistent tachycardia without evidence of recurrent bradycardia since discontinuing AV nodal blockers. On Eliquis 5 mg twice daily for stroke prevention. 2.  Hypotension, resolved, off of pressors, now quite elevated. 3.  Acute diastolic CHF, LVEF 45 to 84%, improved after diuresis 4.  Morbid obesity/obstructive sleep apnea 5.  Chest pain, atypical, borderline elevated high-sensitivity troponin (31,35), likely demand supply ischemia, not due to acute coronary syndrome. No recurrent chest pain.  Recommendations: 1. Add Cardizem CD 120 mg once daily for now with careful monitoring of heart rate and blood pressure 2. Continue Eliquis 5 mg BID for stroke prevention 3. Continue amiodarone 200 mg BID 4. No indication for  pacemaker at this time     Clabe Seal, [name] 05/14/2019 9:58 AM Discussed with Dr. Saralyn Pilar and plan made in collaboration with him.

## 2019-05-14 NOTE — Progress Notes (Signed)
Patient is resting in bed, HR ranging from 130's as high as 180's cardio PA notified. At this time, per cardio, continue to monitor as medications were adjusted today and was hypotensive/ bradycardiac this admission.

## 2019-05-14 NOTE — Progress Notes (Signed)
OT Cancellation Note  Patient Details Name: Joshua Sampson MRN: [id number] DOB: 1976/06/01   Cancelled Treatment:    Reason Eval/Treat Not Completed: Medical issues which prohibited therapy Order received and chart reviewed for OT evaluation.  Pt placed on hold due to BP and ECG HR remaining outside of safe range to work with OT at this time. Will follow remotely and evaluate once medically ready.  Thank you for the referral.  Chrys Racer, OTR/L, [facility] ascom [phone] 05/14/19, 11:11 AM

## 2019-05-15 DIAGNOSIS — J9601 Acute respiratory failure with hypoxia: Secondary | ICD-10-CM

## 2019-05-15 LAB — BASIC METABOLIC PANEL
Anion gap: 11 (ref 5–15)
BUN: 14 mg/dL (ref 6–20)
CO2: 25 mmol/L (ref 22–32)
Calcium: 9.1 mg/dL (ref 8.9–10.3)
Chloride: 100 mmol/L (ref 98–111)
Creatinine, Ser: 1.45 mg/dL — ABNORMAL HIGH (ref 0.61–1.24)
GFR calc Af Amer: >60 mL/min (ref >60)
GFR calc non Af Amer: 58 mL/min — ABNORMAL LOW (ref >60)
Glucose, Bld: 117 mg/dL — ABNORMAL HIGH (ref 70–99)
Potassium: 3.8 mmol/L (ref 3.5–5.1)
Sodium: 136 mmol/L (ref 135–145)

## 2019-05-15 LAB — CULTURE, BLOOD (ROUTINE X 2)
Culture: NO GROWTH
Culture: NO GROWTH
Special Requests: ADEQUATE
Special Requests: ADEQUATE

## 2019-05-15 LAB — CBC
HCT: 48.5 % (ref 39.0–52.0)
Hemoglobin: 16 g/dL (ref 13.0–17.0)
MCH: 29.4 pg (ref 26.0–34.0)
MCHC: 33 g/dL (ref 30.0–36.0)
MCV: 89.2 fL (ref 80.0–100.0)
Platelets: 222 10*3/uL (ref 150–400)
RBC: 5.44 MIL/uL (ref 4.22–5.81)
RDW: 11.9 % (ref 11.5–15.5)
WBC: 5.2 10*3/uL (ref 4.0–10.5)
nRBC: 0 % (ref 0.0–0.2)

## 2019-05-15 LAB — MAGNESIUM: Magnesium: 2 mg/dL (ref 1.7–2.4)

## 2019-05-15 NOTE — Progress Notes (Signed)
OT Cancellation Note  Patient Details Name: Joshua Sampson MRN: [id number] DOB: 1976/04/07   Cancelled Treatment:    Reason Eval/Treat Not Completed: Medical issues which prohibited therapy  Attempted OT evaluation again and per telemetry monitor, pt's HR is fluctuating between 109-137 bpm .  D/t elevated HR at rest, pt does not appear appropriate for exertional activity with therapy; d/t this, will hold OT at this time and re-attempt OT evaluation at a later date/time.   Chrys Racer, OTR/L, [facility] ascom [phone] 05/15/19, 10:07 AM

## 2019-05-15 NOTE — Progress Notes (Signed)
Patient heart rate 135 -150 S with BP 97/56.  RN notified DR Blaine Hamper, he said to let the cardiologist know,   RN notified DR Drema Dallas  Waiting for replied, patient denies any pain .

## 2019-05-15 NOTE — Progress Notes (Signed)
[facility] Cardiology  SUBJECTIVE: The patient reports feeling well this morning, better than he did the week before coming to the hospital. He denies chest pain, shortness of breath, or palpitations. He has been ambulating in his room without difficulty.   Vitals:   05/14/19 2326 05/15/19 0442 05/15/19 0748 05/15/19 0927  BP: (!) 123/103 (!) 132/94 106/78   Pulse: 84 96 72   Resp:  19 (!) 23 20  Temp:  97.6 F (36.4 C) 98.5 F (36.9 C)   TempSrc:  Oral Oral   SpO2:  100% 95%   Weight:  (!) 211 kg    Height:         Intake/Output Summary (Last 24 hours) at 05/15/2019 0934 Last data filed at 05/15/2019 0750 Gross per 24 hour  Intake -  Output 900 ml  Net -900 ml      PHYSICAL EXAM  General: Morbidly obese gentleman sitting up in bed working on his computer, in no acute distress HEENT:  Normocephalic and atramatic Neck:   No JVD.  Lungs: normal effort of breathing on room air, no wheezing Heart: irregularly irregular without gallops or murmurs.  Abdomen: no obvious distention Msk:  Gait not assessed. No obvious deformity. Normal strength and tone for age. Extremities: 1-2+ bilateral lower extremity edema.   Neuro: Alert and oriented X 3. Psych:  Good affect, responds appropriately   LABS: Basic Metabolic Panel: Recent Labs    05/14/19 0601 05/15/19 0524  NA 136 136  K 4.1 3.8  CL 98 100  CO2 28 25  GLUCOSE 111* 117*  BUN 13 14  CREATININE 1.49* 1.45*  CALCIUM 8.9 9.1  MG 2.2 2.0   Liver Function Tests: No results for input(s): AST, ALT, ALKPHOS, BILITOT, PROT, ALBUMIN in the last 72 hours. No results for input(s): LIPASE, AMYLASE in the last 72 hours. CBC: Recent Labs    05/14/19 0601 05/15/19 0524  WBC 4.3 5.2  HGB 15.5 16.0  HCT 48.3 48.5  MCV 91.0 89.2  PLT 194 222   Cardiac Enzymes: No results for input(s): CKTOTAL, CKMB, CKMBINDEX, TROPONINI in the last 72 hours. BNP: Invalid input(s): POCBNP D-Dimer: No results for input(s): DDIMER in the  last 72 hours. Hemoglobin A1C: No results for input(s): HGBA1C in the last 72 hours. Fasting Lipid Panel: No results for input(s): CHOL, HDL, LDLCALC, TRIG, CHOLHDL, LDLDIRECT in the last 72 hours. Thyroid Function Tests: No results for input(s): TSH, T4TOTAL, T3FREE, THYROIDAB in the last 72 hours.  Invalid input(s): FREET3 Anemia Panel: No results for input(s): VITAMINB12, FOLATE, FERRITIN, TIBC, IRON, RETICCTPCT in the last 72 hours.  No results found.   Echo LVEF 45-50%  TELEMETRY: atrial fibrillation with rates 120-130 bpm  ASSESSMENT AND PLAN:  Principal Problem:   Atrial flutter with rapid ventricular response (HCC) Active Problems:   Morbid obesity (HCC)   Hypertension   Headache   Acute renal failure superimposed on stage 2 chronic kidney disease (HCC)   Chest pain   Elevated troponin   Hypotension   Acute respiratory failure with hypoxia (HCC)   Acute systolic CHF (congestive heart failure) (HCC)   UTI (urinary tract infection)   Acute on chronic respiratory failure (HCC)    1. New onset atrial fibrillation with RVR, initially on metoprolol 100 mg twice daily Cardizem 240mg daily,discontinued due to hypotension and bradycardia. Patient started on amiodarone 200 mg twice daily. Heart rate still poorly controlled with consistent tachycardia without evidence of recurrent bradycardia since discontinuing AV nodal blockers. On  Eliquis 5 mg twice daily for stroke prevention. Restarted Cardizem 120 mg yesterday. Heart rate currently 120-130s. Patient asymptomatic. 2.Hypotension, resolved, off of pressors, now quite elevated. 3.Acute diastolic CHF, LVEF 45 to 68%, improved after diuresis 4.Morbid obesity/obstructive sleep apnea 5.Chest pain, atypical, borderline elevated high-sensitivity troponin (31,35), likely demand supply ischemia, not due to acute coronary syndrome. No recurrent chest pain.  Recommendations: 1. Increased Cardizem CD to 180 mg today. We  continue to monitor heart rate and blood pressure 2. Continue Eliquis 5 mg BID for stroke prevention 3. Continue amiodarone 200 mg BID 4. Goal resting heart rate for patient is 110 bpm, then okay to discharge from cardiovascular perspective.  5. No further cardiac diagnostics recommended at this time.    Clabe Seal, PA-C 05/15/2019 9:34 AM

## 2019-05-15 NOTE — Progress Notes (Signed)
Patient heart rate 110's-140's, hospitalist and cardiology (Dr. Blaine Hamper, and Clabe Seal, PA) notified. Per Clabe Seal and Paraschos, heart rate OK as long as HR sustains below 135. Will continue to monitor patient.

## 2019-05-15 NOTE — Plan of Care (Signed)
  Problem: Education: Goal: Knowledge of General Education information will improve Description: Including pain rating scale, medication(s)/side effects and non-pharmacologic comfort measures Outcome: Progressing   Problem: Clinical Measurements: Goal: Will remain free from infection Outcome: Progressing   Problem: Clinical Measurements: Goal: Respiratory complications will improve Outcome: Progressing   

## 2019-05-15 NOTE — Progress Notes (Addendum)
PROGRESS NOTE    Joshua Sampson  RXV:[id number] DOB: 30-Aug-1975 DOA: 05/06/2019 PCP: System, Pcp Not In    Brief Narrative:   Joshua Sampson is a 43 y.o. male with medical history significant for morbid obesity, hypertension and cluster headaches.  Patient states he is overall been doing well.  Through keto diet, he has lost 10 to 15 pounds lately intentionally.  He says in the last couple days has not been sleeping great, but overall he sleeps okay.  He is unsure if he snores, sleeps about 5 to 6 hours a night and says he does feel well rested.  Started having a cluster headache 3 days ago.  Intermittent, lasting about 10 minutes, but he had several repeatedly for the past few days including today so he came into the urgent care.  Prior to coming in, patient said he felt some mild chest palpitations and a little short of breath, but that quickly resolved.   43 y.o. 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  Interim History:  1. 11/5, pt developed hypotension with blood pressure 85/73 mm Hg and pulse rate 42 beats/min. PCCM was consulted.  Vasopressor, Neo was stared. Pt is off IV Neo 11/7. Bp 146 /98 today 2. He denies chest pain and abdominal pain this morning. 3. Still need BiPAP at night 4. UA negative on 11/5, but urine culture positive for Enterococcus FAECALIS and E. Coli 11/8. Since patient has been hypotensive, will start start Rocephin 11/8 -->changed to Amoxicillin per pharm 5. 11/9 Transferred to tele 2A, still needs BiPAP in night 6. 11/9 card added Cardizem CD 180 mg once daily  7. 11/10, still has RVR with HR 120-140s, upto 170 temporarily in the morning, currently rate is 120s. 8. 11/10: in/out: -958 ml  Assessment & Plan:   Principal Problem:   Atrial flutter with rapid  ventricular response (HCC) Active Problems:   Morbid obesity (HCC)   Hypertension   Headache   Acute renal failure superimposed on stage 2 chronic kidney disease (HCC)   Chest pain   Elevated troponin   Hypotension   Acute respiratory failure with hypoxia (HCC)   Acute systolic CHF (congestive heart failure) (HCC)   UTI (urinary tract infection)   Acute on chronic respiratory failure (HCC)  Hypotension: resolved. Patient's mental status is normal.  Etiology is not clear.  May be related to multiple hypertensive medications.  No fever or leukocytosis, low suspicion for sepsis.  PCCM was consulted.  Vasopressor, Neo was started. bp improved. Off IV neo 11/7   CXR showed cardiomegaly and vascular congestion without infiltration. Bp 152/123 in AM.  -pt is off Vasopressor, Neo -Blood cultures no grow so far -PT/OT  Possible UTI: UA negative on 11/5, but urine culture positive for Enterococcus FAECALIS and E. Coli. Since patient has been hypotensive, which could be related UTI, therefore will start antibiotics. -will start Rocephin -->changed to amoxicillin 11/9 per pharm -blood culture no grow so far. -I will check procalcitonine again, if negative and if Bp remain normal --> may d/c abx on 11/11  New onset atrial flutter with rapid ventricular response [facility]): card was consulted. CHA2DS2-VASc Score is 2, needs oral anticoagulation. Patient is started with Eliquis. ECHO with mildly reduced LV function and EF of 45-50%. 11/10 still has RVR with HR 120-140s, upto 170 temporarily in the  morning, currently rate is 120s. - Eliquis 5 mg BID for anticoagulation  - hold diltiazem 240 mg daily and metoprolol 100 mg bid due to hypotension -->added 180 mg of cardizem by Card 11/9 -on Amiodarone 200 mg bid orally   Acute respiratory failure with hypoxia due to new Systolic CHF: ECHO with mildly reduced LV function and EF of 45-50%. No respiratory distress. In/Out -958 -IV lasix 40 mg bid  -still needed  BiPAP in night  Chest pain and troponin elevation:  Patient with mildly elevated HS-troponin to 63 initially, then 62 on repeat. Later on troponin 28. No CP today - on eliquis,  -Card is on board   Morbid obesity: BMI 63.92 -Diet and exercise.   -Encouraged to lose weight.  -need outpatient sleep study  Hypertension: SBp 120s -on Cardizem which is also for A Fib  CKD (chronic kidney disease), stage II: worsening. baseline Cre 1.2. his Cre is 2.39 -->1.75 -->1.49 -->1.49 -->1.49 -->1.45 and BUN 14. May be due to ATN secondary to hypotension and continuation of diuretics. -f/u by BMP  DVT prophylaxis: Eliquis Code Status: Full code Family Communication: not today Disposition Plan: Possible discharge in 1-2 days  barriers for discharge: still has A fib with RVR, still needs BiPAP at night    Consultants:   card  Procedures:    Antimicrobials: Anti-infectives (From admission, onward)   Start     Dose/Rate Route Frequency Ordered Stop   05/14/19 1400  amoxicillin (AMOXIL) capsule 500 mg     500 mg Oral Every 8 hours 05/14/19 1253     05/13/19 1800  cefTRIAXone (ROCEPHIN) 2 g in sodium chloride 0.9 % 100 mL IVPB  Status:  Discontinued     2 g 200 mL/hr over 30 Minutes Intravenous Every 24 hours 05/13/19 1717 05/14/19 1253       Subjective:  Patient states he has no CP, SOB, AP, fever or chills.    Objective: Vitals:   05/14/19 1634 05/14/19 2013 05/14/19 2326 05/15/19 0442  BP: (!) 142/122 (!) 128/107 (!) 123/103 (!) 132/94  Pulse: (!) 115 (!) 55 84 96  Resp: Temp: 98.4 F (36.9 C) 98.5 F (36.9 C)  97.6 F (36.4 C)  TempSrc:  Oral  Oral  SpO2: 96% 97%  100%  Weight:    (!) 211 kg  Height:        Intake/Output Summary (Last 24 hours) at 05/15/2019 0653 Last data filed at 05/15/2019 1610 Gross per 24 hour  Intake 316.07 ml  Output 1275 ml  Net -958.93 ml   Filed Weights   05/13/19 0259 05/14/19 0500 05/15/19 0442  Weight: (!) 214.1 kg (!)  213.7 kg (!) 211 kg    Examination: Physical Exam:  General: Not in acute distress HEENT: PERRL, EOMI, no scleral icterus, No JVD or bruit Cardiac: S1/S2, RRR, No murmurs, gallops or rubs Pulm: Clear to auscultation bilaterally. No rales, wheezing, rhonchi or rubs. Abd: Soft, nondistended, nontender, no rebound pain, no organomegaly, BS present Ext: 1+ leg pitting edema bilaterally. 2+DP/PT pulse bilaterally Musculoskeletal: No joint deformities, erythema, or stiffness, ROM full Skin: No rashes.  Neuro: Alert and oriented X3, cranial nerves II-XII grossly intact. Psych: Patient is not psychotic, no suicidal or hemocidal ideation.    Data Reviewed: I have personally reviewed following labs and imaging studies  CBC: Recent Labs  Lab 05/10/19 0208 05/11/19 0429 05/12/19 0421 05/14/19 0601 05/15/19 0524  WBC 6.5 5.4 4.6 4.3 5.2  NEUTROABS  4.3 2.9  --   --   --   HGB 14.9 14.2 14.5 15.5 16.0  HCT 46.4 45.1 45.9 48.3 48.5  MCV 92.1 93.4 92.4 91.0 89.2  PLT 243 188 176 194 295   Basic Metabolic Panel: Recent Labs  Lab 05/09/19 2105  05/11/19 0429 05/12/19 0421 05/13/19 0426 05/14/19 0601 05/15/19 0524  NA 134*   < > 136 138 138 136 136  K 3.6   < > 3.7 3.6 3.6 4.1 3.8  CL 99   < > 101 101 101 98 100  CO2 20*   < > 26 28 28 28 25   GLUCOSE 165*   < > 103* 101* 112* 111* 117*  BUN 25*   < > 25* 17 15 13 14   CREATININE 2.41*   < > 1.75* 1.49* 1.49* 1.49* 1.45*  CALCIUM 8.7*   < > 8.5* 8.8* 8.9 8.9 9.1  MG 2.2   < > 2.0 1.8 1.9 2.2 2.0  PHOS 4.8*  --   --   --   --   --   --    < > = values in this interval not displayed.   GFR: Estimated Creatinine Clearance: 117.9 mL/min (A) (by C-G formula based on SCr of 1.45 mg/dL (H)). Liver Function Tests: Recent Labs  Lab 05/10/19 0208 05/10/19 1738 05/11/19 0429  AST 54* 38 39  ALT 99* 88* 85*  ALKPHOS 49 51 53  BILITOT 1.5* 1.4* 1.5*  PROT 7.1 6.9 6.7  ALBUMIN 3.7 3.4* 3.4*   No results for input(s): LIPASE,  AMYLASE in the last 168 hours. No results for input(s): AMMONIA in the last 168 hours. Coagulation Profile: No results for input(s): INR, PROTIME in the last 168 hours. Cardiac Enzymes: No results for input(s): CKTOTAL, CKMB, CKMBINDEX, TROPONINI in the last 168 hours. BNP (last 3 results) No results for input(s): PROBNP in the last 8760 hours. HbA1C: No results for input(s): HGBA1C in the last 72 hours. CBG: Recent Labs  Lab 05/13/19 1601 05/13/19 1933 05/14/19 0010 05/14/19 0404 05/14/19 0745  GLUCAP 112* 106* 114* 104* 91   Lipid Profile: No results for input(s): CHOL, HDL, LDLCALC, TRIG, CHOLHDL, LDLDIRECT in the last 72 hours. Thyroid Function Tests: No results for input(s): TSH, T4TOTAL, FREET4, T3FREE, THYROIDAB in the last 72 hours. Anemia Panel: No results for input(s): VITAMINB12, FOLATE, FERRITIN, TIBC, IRON, RETICCTPCT in the last 72 hours. Sepsis Labs: Recent Labs  Lab 05/09/19 2117 05/09/19 2317 05/10/19 0200 05/11/19 0429 05/12/19 0421  PROCALCITON <0.10  --   --  <0.10 <0.10  LATICACIDVEN  --  3.0* 2.0*  --  1.0    Recent Results (from the past 240 hour(s))  MRSA PCR Screening     Status: None   Collection Time: 05/06/19  2:19 PM   Specimen: Nasopharyngeal  Result Value Ref Range Status   MRSA by PCR NEGATIVE NEGATIVE Final    Comment:        The GeneXpert MRSA Assay (FDA approved for NASAL specimens only), is one component of a comprehensive MRSA colonization surveillance program. It is not intended to diagnose MRSA infection nor to guide or monitor treatment for MRSA infections. Performed at [facility], [address] [place], [place] [address]   SARS Coronavirus 2 by RT PCR (hospital order, performed in [facility] hospital lab) Nasopharyngeal Nasopharyngeal Swab     Status: None   Collection Time: 05/06/19  4:24 PM   Specimen: Nasopharyngeal Swab  Result Value  Ref Range Status   SARS Coronavirus 2 NEGATIVE NEGATIVE Final     Comment: (NOTE) If result is NEGATIVE SARS-CoV-2 target nucleic acids are NOT DETECTED. The SARS-CoV-2 RNA is generally detectable in upper and lower  respiratory specimens during the acute phase of infection. The lowest  concentration of SARS-CoV-2 viral copies this assay can detect is 250  copies / mL. A negative result does not preclude SARS-CoV-2 infection  and should not be used as the sole basis for treatment or other  patient management decisions.  A negative result may occur with  improper specimen collection / handling, submission of specimen other  than nasopharyngeal swab, presence of viral mutation(s) within the  areas targeted by this assay, and inadequate number of viral copies  (<250 copies / mL). A negative result must be combined with clinical  observations, patient history, and epidemiological information. If result is POSITIVE SARS-CoV-2 target nucleic acids are DETECTED. The SARS-CoV-2 RNA is generally detectable in upper and lower  respiratory specimens dur ing the acute phase of infection.  Positive  results are indicative of active infection with SARS-CoV-2.  Clinical  correlation with patient history and other diagnostic information is  necessary to determine patient infection status.  Positive results do  not rule out bacterial infection or co-infection with other viruses. If result is PRESUMPTIVE POSTIVE SARS-CoV-2 nucleic acids MAY BE PRESENT.   A presumptive positive result was obtained on the submitted specimen  and confirmed on repeat testing.  While 2019 novel coronavirus  (SARS-CoV-2) nucleic acids may be present in the submitted sample  additional confirmatory testing may be necessary for epidemiological  and / or clinical management purposes  to differentiate between  SARS-CoV-2 and other Sarbecovirus currently known to infect humans.  If clinically indicated additional testing with an alternate test  methodology [phone] is advised. The SARS-CoV-2  RNA is generally  detectable in upper and lower respiratory sp ecimens during the acute  phase of infection. The expected result is Negative. Fact Sheet for Patients:  BoilerBrush.com.cyhttps://www.fda.gov/media/136312/download Fact Sheet for Healthcare Providers: https://pope.com/https://www.fda.gov/media/136313/download This test is not yet approved or cleared by the [place] FDA and has been authorized for detection and/or diagnosis of SARS-CoV-2 by FDA under an Emergency Use Authorization (EUA).  This EUA will remain in effect (meaning this test can be used) for the duration of the COVID-19 declaration under Section 564(b)(1) of the Act, 21 U.S.C. section 360bbb-3(b)(1), unless the authorization is terminated or revoked sooner. Performed at [facility], [address] [place], [place] [address]   CULTURE, BLOOD (ROUTINE X 2) w Reflex to ID Panel     Status: None (Preliminary result)   Collection Time: 05/10/19 11:28 AM   Specimen: BLOOD  Result Value Ref Range Status   Specimen Description BLOOD BLOOD RIGHT HAND  Final   Special Requests   Final    BOTTLES DRAWN AEROBIC AND ANAEROBIC Blood Culture adequate volume   Culture   Final    NO GROWTH 4 DAYS Performed at [facility], [address] [place], [place] [address]    Report Status PENDING  Incomplete  CULTURE, BLOOD (ROUTINE X 2) w Reflex to ID Panel     Status: None (Preliminary result)   Collection Time: 05/10/19  1:53 PM   Specimen: BLOOD  Result Value Ref Range Status   Specimen Description BLOOD BLOOD RIGHT HAND  Final   Special Requests   Final    BOTTLES DRAWN AEROBIC AND ANAEROBIC Blood Culture adequate volume  Culture   Final    NO GROWTH 4 DAYS Performed at [facility], [address] [place], [place] [address]    Report Status PENDING  Incomplete  Urine Culture     Status: Abnormal   Collection Time: 05/10/19  7:27 PM   Specimen: Urine, Random  Result Value Ref Range Status   Specimen Description    Final    URINE, RANDOM Performed at [facility], [address] [place], [place] [address]    Special Requests   Final    NONE Performed at [facility], [address] [place], [place] [address]    Culture (A)  Final    >=100,000 COLONIES/mL ESCHERICHIA COLI >=100,000 COLONIES/mL ENTEROCOCCUS FAECALIS    Report Status 05/13/2019 FINAL  Final   Organism ID, Bacteria ENTEROCOCCUS FAECALIS (A)  Final   Organism ID, Bacteria ESCHERICHIA COLI (A)  Final      Susceptibility   Escherichia coli - MIC*    AMPICILLIN <=2 SENSITIVE Sensitive     CEFAZOLIN <=4 SENSITIVE Sensitive     CEFTRIAXONE <=1 SENSITIVE Sensitive     CIPROFLOXACIN <=0.25 SENSITIVE Sensitive     GENTAMICIN <=1 SENSITIVE Sensitive     IMIPENEM <=0.25 SENSITIVE Sensitive     NITROFURANTOIN <=16 SENSITIVE Sensitive     TRIMETH/SULFA <=20 SENSITIVE Sensitive     AMPICILLIN/SULBACTAM <=2 SENSITIVE Sensitive     PIP/TAZO <=4 SENSITIVE Sensitive     Extended ESBL NEGATIVE Sensitive     * >=100,000 COLONIES/mL ESCHERICHIA COLI   Enterococcus faecalis - MIC*    AMPICILLIN <=2 SENSITIVE Sensitive     LEVOFLOXACIN 1 SENSITIVE Sensitive     NITROFURANTOIN <=16 SENSITIVE Sensitive     VANCOMYCIN 1 SENSITIVE Sensitive     * >=100,000 COLONIES/mL ENTEROCOCCUS FAECALIS      Radiology Studies: No results found.      Scheduled Meds: . alum & mag hydroxide-simeth  30 mL Oral Once   And  . lidocaine  15 mL Oral Once  . amiodarone  200 mg Oral BID  . amoxicillin  500 mg Oral Q8H  . apixaban  5 mg Oral BID  . Chlorhexidine Gluconate Cloth  6 each Topical Daily  . diltiazem  180 mg Oral Daily  . mouth rinse  15 mL Mouth Rinse BID  . pantoprazole (PROTONIX) IV  40 mg Intravenous Q24H  . sodium chloride flush  3 mL Intravenous Q12H   Continuous Infusions: . sodium chloride 250 mL (05/10/19 1145)     LOS: 9 days    Time spent: 30 min    Lorretta Harp, DO [facility] PAGER is on AMION   If 7PM-7AM, please contact night-coverage [email or web address] Password [facility] 05/15/2019, 6:53 AM

## 2019-05-15 NOTE — Progress Notes (Signed)
PT Cancellation Note  Patient Details Name: Joshua Sampson MRN: [id number] DOB: 1976/02/14   Cancelled Treatment:    Reason Eval/Treat Not Completed: Medical issues which prohibited therapy.  PT consult received.  Chart reviewed.  Pt's HR fluctuating between 110-137 bpm on telemetry (pt noted to be sleeping in bed).  D/t elevated HR at rest, pt does not appear appropriate for exertional activity with therapy; d/t this, will hold PT at this time and re-attempt PT evaluation at a later date/time.  Of note, per Cardiology note this morning "He has been ambulating in his room without difficulty".   Leitha Bleak, PT 05/15/19, 10:00 AM [phone]

## 2019-05-16 DIAGNOSIS — N3000 Acute cystitis without hematuria: Secondary | ICD-10-CM

## 2019-05-16 DIAGNOSIS — I5031 Acute diastolic (congestive) heart failure: Secondary | ICD-10-CM

## 2019-05-16 DIAGNOSIS — I4891 Unspecified atrial fibrillation: Secondary | ICD-10-CM

## 2019-05-16 DIAGNOSIS — R778 Other specified abnormalities of plasma proteins: Secondary | ICD-10-CM

## 2019-05-16 LAB — COMPREHENSIVE METABOLIC PANEL
ALT: 66 U/L — ABNORMAL HIGH (ref 0–44)
AST: 34 U/L (ref 15–41)
Albumin: 3.4 g/dL — ABNORMAL LOW (ref 3.5–5.0)
Alkaline Phosphatase: 52 U/L (ref 38–126)
Anion gap: 9 (ref 5–15)
BUN: 13 mg/dL (ref 6–20)
CO2: 27 mmol/L (ref 22–32)
Calcium: 9 mg/dL (ref 8.9–10.3)
Chloride: 100 mmol/L (ref 98–111)
Creatinine, Ser: 1.32 mg/dL — ABNORMAL HIGH (ref 0.61–1.24)
GFR calc Af Amer: >60 mL/min (ref >60)
GFR calc non Af Amer: >60 mL/min (ref >60)
Glucose, Bld: 102 mg/dL — ABNORMAL HIGH (ref 70–99)
Potassium: 3.6 mmol/L (ref 3.5–5.1)
Sodium: 136 mmol/L (ref 135–145)
Total Bilirubin: 1.5 mg/dL — ABNORMAL HIGH (ref 0.3–1.2)
Total Protein: 7.2 g/dL (ref 6.5–8.1)

## 2019-05-16 LAB — PROCALCITONIN: Procalcitonin: <0.1 ng/mL

## 2019-05-16 LAB — CBC
HCT: 47.9 % (ref 39.0–52.0)
Hemoglobin: 15.6 g/dL (ref 13.0–17.0)
MCH: 29.3 pg (ref 26.0–34.0)
MCHC: 32.6 g/dL (ref 30.0–36.0)
MCV: 90 fL (ref 80.0–100.0)
Platelets: 184 10*3/uL (ref 150–400)
RBC: 5.32 MIL/uL (ref 4.22–5.81)
RDW: 11.9 % (ref 11.5–15.5)
WBC: 4.7 10*3/uL (ref 4.0–10.5)
nRBC: 0 % (ref 0.0–0.2)

## 2019-05-16 LAB — MAGNESIUM: Magnesium: 1.9 mg/dL (ref 1.7–2.4)

## 2019-05-16 MED ORDER — APIXABAN 5 MG PO TABS: 5.0000 mg | ORAL_TABLET | Freq: Two times a day (BID) | ORAL | 0 refills | Status: DC

## 2019-05-16 MED ORDER — DILTIAZEM HCL ER COATED BEADS 180 MG PO CP24: 180.0000 mg | ORAL_CAPSULE | Freq: Every day | ORAL | 0 refills | Status: AC

## 2019-05-16 MED ORDER — AMIODARONE HCL 200 MG PO TABS: ORAL_TABLET | ORAL | 0 refills | Status: AC

## 2019-05-16 MED ORDER — AMOXICILLIN 500 MG PO CAPS: 500.0000 mg | ORAL_CAPSULE | Freq: Three times a day (TID) | ORAL | 0 refills | Status: DC

## 2019-05-16 MED ORDER — HYDROCHLOROTHIAZIDE 25 MG PO TABS
25.0000 mg | ORAL_TABLET | Freq: Every day | ORAL | Status: DC
  Administered 2019-05-16: 25 mg via ORAL
  Filled 2019-05-16: qty 1

## 2019-05-16 MED ORDER — AMIODARONE HCL 200 MG PO TABS
400.0000 mg | ORAL_TABLET | Freq: Two times a day (BID) | ORAL | Status: DC
  Administered 2019-05-16: 400 mg via ORAL
  Filled 2019-05-16: qty 2

## 2019-05-16 NOTE — Progress Notes (Signed)
OT Cancellation Note  Patient Details Name: Joshua Sampson MRN: [id number] DOB: 1975/09/14   Cancelled Treatment:    Reason Eval/Treat Not Completed: OT screened, no needs identified, will sign off   When OT presented to room for Eval, pt able to stand to adjust bed side table and laptop orientation, demos good dynamic standing balance and safety awareness. In addition, OT quickly screens pt's dressing capability and pt able to perform all aspects of LB dressing I'ly without AD/AE. Will sign off at this time as pt does not appear to have any acute OT needs.   Gerrianne Scale, MS, OTR/L ascom [phone] or [phone] 05/16/19, 11:16 AM

## 2019-05-16 NOTE — Progress Notes (Signed)
[facility] Cardiology  SUBJECTIVE: The patient reports feeling well with no complaints this morning. He denies chest pain, shortness of breath, or palpitations, and is eager to go home.   Vitals:   05/15/19 1945 05/16/19 0020 05/16/19 0432 05/16/19 0811  BP: (!) 140/117 (!) 142/115 (!) 125/98 (!) 130/110  Pulse: 88  (!) 54 96  Resp: 19  17 19   Temp: 98.8 F (37.1 C)  98.5 F (36.9 C) 98.3 F (36.8 C)  TempSrc: Oral  Oral Oral  SpO2: 96%  100% 96%  Weight:   (!) 210.4 kg   Height:         Intake/Output Summary (Last 24 hours) at 05/16/2019 0842 Last data filed at 05/16/2019 13/05/2019 Gross per 24 hour  Intake 240 ml  Output 1350 ml  Net -1110 ml      PHYSICAL EXAM  General:Morbidly obese gentleman sitting up in bed working on his computer, in no acute distress HEENT:Normocephalic and atramatic Neck: No JVD.  Lungs:normal effort of breathing on room air, no wheezing Heart:irregularly irregular without gallops or murmurs.  Abdomen:no obvious distention 7846 not assessed. No obvious deformity.Normal strength and tone for age. Extremities:1+ bilateral lower extremityedema.  Neuro: Alert and oriented X 3. Psych: Good affect, responds appropriately   LABS: Basic Metabolic Panel: Recent Labs    05/15/19 0524 05/16/19 0527  NA 136 136  K 3.8 3.6  CL 100 100  CO2 25 27  GLUCOSE 117* 102*  BUN 14 13  CREATININE 1.45* 1.32*  CALCIUM 9.1 9.0  MG 2.0 1.9   Liver Function Tests: Recent Labs    05/16/19 0527  AST 34  ALT 66*  ALKPHOS 52  BILITOT 1.5*  PROT 7.2  ALBUMIN 3.4*   No results for input(s): LIPASE, AMYLASE in the last 72 hours. CBC: Recent Labs    05/15/19 0524 05/16/19 0527  WBC 5.2 4.7  HGB 16.0 15.6  HCT 48.5 47.9  MCV 89.2 90.0  PLT 222 184   Cardiac Enzymes: No results for input(s): CKTOTAL, CKMB, CKMBINDEX, TROPONINI in the last 72 hours. BNP: Invalid input(s): POCBNP D-Dimer: No results for input(s): DDIMER in the last 72  hours. Hemoglobin A1C: No results for input(s): HGBA1C in the last 72 hours. Fasting Lipid Panel: No results for input(s): CHOL, HDL, LDLCALC, TRIG, CHOLHDL, LDLDIRECT in the last 72 hours. Thyroid Function Tests: No results for input(s): TSH, T4TOTAL, T3FREE, THYROIDAB in the last 72 hours.  Invalid input(s): FREET3 Anemia Panel: No results for input(s): VITAMINB12, FOLATE, FERRITIN, TIBC, IRON, RETICCTPCT in the last 72 hours.  No results found.   Echo LVEF 45-50%  TELEMETRY: atrial fibrillation, rate 110-120s bpm  ASSESSMENT AND PLAN:  Principal Problem:   Atrial flutter with rapid ventricular response (HCC) Active Problems:   Morbid obesity (HCC)   Hypertension   Headache   Acute renal failure superimposed on stage 2 chronic kidney disease (HCC)   Chest pain   Elevated troponin   Hypotension   Acute respiratory failure with hypoxia (HCC)   Acute systolic CHF (congestive heart failure) (HCC)   UTI (urinary tract infection)   Acute on chronic respiratory failure (HCC)   1. New onset atrial fibrillation with RVR, initially on metoprolol 100 mg twice daily Cardizem 240mg daily,discontinued due to hypotension and bradycardia. Patient started on amiodarone 200 mg twice daily. Heart rate still poorly controlled with consistent tachycardia without evidence of recurrent bradycardia since discontinuing AV nodal blockers. On Eliquis 5 mg twice daily for stroke prevention. Restarted Cardizem  120 mg, which was increased to 180 mg yesterday. Heart rate currently 110-120s. Patient asymptomatic. 2.Hypotension, resolved, off of pressors, improved and stable on Cardizem. 3.Acute diastolic CHF, LVEF 45 to 15%, improved after diuresis 4.Morbid obesity/obstructive sleep apnea 5.Chest pain, atypical, borderline elevated high-sensitivity troponin (31,35), likely demand supply ischemia, not due to acute coronary syndrome.No recurrent chest pain.  Recommendations: 1. Increase  amiodarone to 400 mg BID  2. Continue Eliquis 5 mg BID for stroke prevention 3. Continue Cardizem CD 180 mg 4.Recommend ambulating and discharge today if clinically stable 5. Follow-up with Dr. Saralyn Pilar, preferably early next week (11/16 or 11/17) at which time amiodarone dose will be tapered down.    Clabe Seal, PA-C 05/16/2019 8:42 AM

## 2019-05-16 NOTE — Progress Notes (Signed)
PT Cancellation Note  Patient Details Name: Joshua Sampson MRN: [id number] DOB: 11-May-1976   Cancelled Treatment:    Reason Eval/Treat Not Completed: Other (comment).  Pt discharged from hospital prior to being able to initiate PT evaluation.  Recommend pt follow-up with PCP for any potential PT needs.  Leitha Bleak, PT 05/16/19, 2:10 PM [phone]

## 2019-05-16 NOTE — Discharge Summary (Signed)
[facility] [place] NAME: Joshua Sampson    MR#:  [id number]  DATE OF BIRTH:  [birth year]  DATE OF ADMISSION:  05/06/2019 ADMITTING PHYSICIAN: Annita Brod, MD  DATE OF DISCHARGE: 05/16/2019  1:32 PM  PRIMARY CARE PHYSICIAN: Dr. 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 Hypertension   . Obesity     HOSPITAL COURSE:   1.  Atrial fibrillation with rapid ventricular response.  Cardiology recommended increasing amiodarone to 400 mg twice daily and continue Cardizem CD 180 mg daily.  Patient is also on Eliquis 5 mg twice daily for stroke prevention.  Patient did feel okay without any symptoms even though his heart rate was on the higher side.  Close clinical follow-up with cardiology as outpatient.  Heart rate did vary between 110 and 120.  Patient cleared by cardiology to go home. 2.  Hypotension on presentation.  The patient did require pressors on presentation.  This has resolved and blood pressure is now elevated.  Patient on Cardizem and hydrochlorothiazide restarted.  The blood pressure cuff that they using is too small for his arm which could get some falsely elevated readings.  Hesitant on going up further on blood pressure medication since he was hypotensive on presentation. 3.  Acute hypoxic  respiratory failure.  This has improved.  Patient off oxygen and saturating well. 4.  Morbid obesity with a BMI of 64.69.  Weight loss needed.  Recommend outpatient sleep study to see if he has sleep apnea. 5. Acute diastolic congestive heart failure.  This has improved after diuresis.  EF 45 to 50%. 6.  UTI.  Enterococcus growing.  Patient was started on Rocephin and then switched over to amoxicillin.  Will give 4-1/2 more days. 7.  Chest pain and elevated troponin likely demand ischemia  DISCHARGE CONDITIONS:   Satisfactory  CONSULTS OBTAINED:  Treatment Team:  Corey Skains, MD Isaias Cowman, MD  DRUG ALLERGIES:  No Known Allergies  DISCHARGE MEDICATIONS:   Allergies as of 05/16/2019   No Known Allergies     Medication List    STOP taking these medications   amLODipine 5 MG tablet Commonly known as: NORVASC   losartan 50 MG tablet Commonly known as: COZAAR     TAKE these medications   amiodarone 200 MG tablet Commonly known as: PACERONE Two pills twice a day for one week then follow cardiologist instructions after that.   amoxicillin 500 MG capsule Commonly known as: AMOXIL Take 1 capsule (500 mg total) by mouth every 8 (eight) hours.   apixaban 5 MG Tabs tablet Commonly known as: ELIQUIS Take 1 tablet (5 mg total) by mouth 2 (two) times daily.   diltiazem 180 MG 24 hr capsule Commonly known as: CARDIZEM CD Take 1 capsule (180 mg total) by mouth daily.   Fish Oil  1000 MG Caps Take 1,000 mg by mouth 4 (four) times daily.   hydrochlorothiazide 25 MG tablet Commonly known as: HYDRODIURIL TAKE 1 TABLET BY MOUTH DAILY   multivitamin with minerals Tabs tablet Take 1 tablet by mouth daily.        DISCHARGE INSTRUCTIONS:   Follow-up PMD 5 days Follow-up cardiology 1 week  If you experience worsening of your admission symptoms, develop shortness of breath, life threatening emergency, suicidal or homicidal thoughts you must seek medical  attention immediately by calling 911 or calling your MD immediately  if symptoms less severe.  You Must read complete instructions/literature along with all the possible adverse reactions/side effects for all the Medicines you take and that have been prescribed to you. Take any new Medicines after you have completely understood and accept all the possible adverse reactions/side effects.   Please note  You were cared for by a hospitalist during your hospital stay. If you have any questions about your discharge medications or the care you received while you were in the hospital after you are discharged, you can call the unit and asked to speak with the hospitalist on call if the hospitalist that took care of you is not available. Once you are discharged, your primary care physician will handle any further medical issues. Please note that NO REFILLS for any discharge medications will be authorized once you are discharged, as it is imperative that you return to your primary care physician (or establish a relationship with a primary care physician if you do not have one) for your aftercare needs so that they can reassess your need for medications and monitor your lab values.    Today   CHIEF COMPLAINT:   Chief Complaint  Patient presents with  . Headache  . Tachycardia  . Shortness of Breath    HISTORY OF PRESENT ILLNESS:  Joshua Sampson  is a 43 y.o. male came in with headache shortness of breath and fast heart rate.   VITAL SIGNS:  Blood pressure (!) 114/99, pulse (!) 121, temperature (!) 97.5 F (36.4 C), temperature source Oral, resp. rate 19, height 5\' 11"  (1.803 m), weight (!) 210.4 kg, SpO2 98 %.   PHYSICAL EXAMINATION:  GENERAL:  43 y.o.-year-old patient lying in the bed with no acute distress.  EYES: Pupils equal, round, reactive to light and accommodation. No scleral icterus. Extraocular muscles intact.  HEENT: Head atraumatic, normocephalic. Oropharynx and nasopharynx clear.   NECK:  Supple, no jugular venous distention. No thyroid enlargement, no tenderness.  LUNGS: Normal breath sounds bilaterally, no wheezing, rales,rhonchi or crepitation. No use of accessory muscles of respiration.  CARDIOVASCULAR: S1, S2 normal. No murmurs, rubs, or gallops.  ABDOMEN: Soft, non-tender, non-distended. Bowel sounds present. No organomegaly or mass.  EXTREMITIES: 2+ pedal edema, no cyanosis, or clubbing.  NEUROLOGIC: Cranial nerves II through XII are intact. Muscle strength 5/5 in all extremities. Sensation intact. Gait not checked.  PSYCHIATRIC: The patient is alert and oriented x 3.  SKIN: No obvious rash, lesion, or ulcer.   DATA REVIEW:   CBC Recent Labs  Lab 05/16/19 0527  WBC 4.7  HGB 15.6  HCT 47.9  PLT 184    Chemistries  Recent Labs  Lab 05/16/19 0527  NA 136  K 3.6  CL 100  CO2 27  GLUCOSE 102*  BUN 13  CREATININE 1.32*  CALCIUM 9.0  MG 1.9  AST 34  ALT 66*  ALKPHOS 52  BILITOT 1.5*    Microbiology Results  Results  for orders placed or performed during the hospital encounter of 05/06/19  MRSA PCR Screening     Status: None   Collection Time: 05/06/19  2:19 PM   Specimen: Nasopharyngeal  Result Value Ref Range Status   MRSA by PCR NEGATIVE NEGATIVE Final    Comment:        The GeneXpert MRSA Assay (FDA approved for NASAL specimens only), is one component of a comprehensive MRSA colonization surveillance program. It is not intended to diagnose MRSA infection nor to guide or monitor treatment for MRSA infections. Performed at [facility], [address] [place], [place] [address]   SARS Coronavirus 2 by RT PCR (hospital order, performed in [facility] hospital lab) Nasopharyngeal Nasopharyngeal Swab     Status: None   Collection Time: 05/06/19  4:24 PM   Specimen: Nasopharyngeal Swab  Result Value Ref Range Status   SARS Coronavirus 2 NEGATIVE NEGATIVE Final    Comment: (NOTE) If result is NEGATIVE SARS-CoV-2 target  nucleic acids are NOT DETECTED. The SARS-CoV-2 RNA is generally detectable in upper and lower  respiratory specimens during the acute phase of infection. The lowest  concentration of SARS-CoV-2 viral copies this assay can detect is 250  copies / mL. A negative result does not preclude SARS-CoV-2 infection  and should not be used as the sole basis for treatment or other  patient management decisions.  A negative result may occur with  improper specimen collection / handling, submission of specimen other  than nasopharyngeal swab, presence of viral mutation(s) within the  areas targeted by this assay, and inadequate number of viral copies  (<250 copies / mL). A negative result must be combined with clinical  observations, patient history, and epidemiological information. If result is POSITIVE SARS-CoV-2 target nucleic acids are DETECTED. The SARS-CoV-2 RNA is generally detectable in upper and lower  respiratory specimens dur ing the acute phase of infection.  Positive  results are indicative of active infection with SARS-CoV-2.  Clinical  correlation with patient history and other diagnostic information is  necessary to determine patient infection status.  Positive results do  not rule out bacterial infection or co-infection with other viruses. If result is PRESUMPTIVE POSTIVE SARS-CoV-2 nucleic acids MAY BE PRESENT.   A presumptive positive result was obtained on the submitted specimen  and confirmed on repeat testing.  While 2019 novel coronavirus  (SARS-CoV-2) nucleic acids may be present in the submitted sample  additional confirmatory testing may be necessary for epidemiological  and / or clinical management purposes  to differentiate between  SARS-CoV-2 and other Sarbecovirus currently known to infect humans.  If clinically indicated additional testing with an alternate test  methodology [phone]([id number]) is advised. The SARS-CoV-2 RNA is generally  detectable in upper and lower  respiratory sp ecimens during the acute  phase of infection. The expected result is Negative. Fact Sheet for Patients:  BoilerBrush.com.cyhttps://www.fda.gov/media/136312/download Fact Sheet for Healthcare Providers: https://pope.com/https://www.fda.gov/media/136313/download This test is not yet approved or cleared by the [place] FDA and has been authorized for detection and/or diagnosis of SARS-CoV-2 by FDA under an Emergency Use Authorization (EUA).  This EUA will remain in effect (meaning this test can be used) for the duration of the COVID-19 declaration under Section 564(b)(1) of the Act, 21 U.S.C. section 360bbb-3(b)(1), unless the authorization is terminated or revoked sooner. Performed at [facility], [address] [place], [place] [address]   CULTURE, BLOOD (ROUTINE X 2) w Reflex to ID Panel     Status:  None   Collection Time: 05/10/19 11:28 AM   Specimen: BLOOD  Result Value Ref Range Status   Specimen Description BLOOD BLOOD RIGHT HAND  Final   Special Requests   Final    BOTTLES DRAWN AEROBIC AND ANAEROBIC Blood Culture adequate volume   Culture   Final    NO GROWTH 5 DAYS Performed at [facility], [address] [place], [place] [address]    Report Status 05/15/2019 FINAL  Final  CULTURE, BLOOD (ROUTINE X 2) w Reflex to ID Panel     Status: None   Collection Time: 05/10/19  1:53 PM   Specimen: BLOOD  Result Value Ref Range Status   Specimen Description BLOOD BLOOD RIGHT HAND  Final   Special Requests   Final    BOTTLES DRAWN AEROBIC AND ANAEROBIC Blood Culture adequate volume   Culture   Final    NO GROWTH 5 DAYS Performed at [facility], [address] [place], [place] [address]    Report Status 05/15/2019 FINAL  Final  Urine Culture     Status: Abnormal   Collection Time: 05/10/19  7:27 PM   Specimen: Urine, Random  Result Value Ref Range Status   Specimen Description   Final    URINE, RANDOM Performed at [facility], [address] [place], [place] [address]    Special Requests   Final    NONE Performed at [facility], [address] [place], [place] [address]    Culture (A)  Final    >=100,000 COLONIES/mL ESCHERICHIA COLI >=100,000 COLONIES/mL ENTEROCOCCUS FAECALIS    Report Status 05/13/2019 FINAL  Final   Organism ID, Bacteria ENTEROCOCCUS FAECALIS (A)  Final   Organism ID, Bacteria ESCHERICHIA COLI (A)  Final      Susceptibility   Escherichia coli - MIC*    AMPICILLIN <=2 SENSITIVE Sensitive     CEFAZOLIN <=4 SENSITIVE Sensitive     CEFTRIAXONE <=1 SENSITIVE Sensitive     CIPROFLOXACIN <=0.25 SENSITIVE Sensitive     GENTAMICIN <=1 SENSITIVE Sensitive     IMIPENEM <=0.25 SENSITIVE Sensitive     NITROFURANTOIN <=16 SENSITIVE Sensitive     TRIMETH/SULFA <=20 SENSITIVE Sensitive     AMPICILLIN/SULBACTAM <=2 SENSITIVE Sensitive     PIP/TAZO <=4 SENSITIVE Sensitive     Extended ESBL NEGATIVE Sensitive     * >=100,000 COLONIES/mL ESCHERICHIA COLI   Enterococcus faecalis - MIC*    AMPICILLIN <=2 SENSITIVE Sensitive     LEVOFLOXACIN 1 SENSITIVE Sensitive     NITROFURANTOIN <=16 SENSITIVE Sensitive     VANCOMYCIN 1 SENSITIVE Sensitive     * >=100,000 COLONIES/mL ENTEROCOCCUS FAECALIS       Management plans discussed with the patient, and he is in agreement.  CODE STATUS:     Code Status Orders  (From admission, onward)         Start     Ordered   05/06/19 1804  Full code  Continuous     05/06/19 1803        Code Status History    This patient has a current code status but no historical code status.   Advance Care Planning Activity      TOTAL TIME TAKING CARE OF THIS PATIENT: 35 minutes.    Alford Highland M.D on 05/16/2019 at 4:16 PM  Between 7am to 6pm - Pager - [phone]  After 6pm go to [email or web address] - password [email or web address]  [facility]  CC: Primary care physician;  Dr. Joyice Faster at [facility]

## 2019-05-16 NOTE — Clinical Social Work Note (Signed)
Patient has orders to discharge home today. CSW provided patient with 30-day free trial Eliquis card. Also requested benefits check.  CSW signing off.  Dayton Scrape, [place]

## 2019-05-16 NOTE — TOC Benefit Eligibility Note (Signed)
Transition of Care [facility]) Benefit Eligibility Note    Patient Details  Name: Joshua Sampson MRN: [id number] Date of Birth: April 16, 1976   Medication/Dose: Eliquis 5mg  tablet PO BID  Covered?: Yes  Prescription Coverage Preferred Pharmacy: [facility]  Spoke with Person/Company/Phone Number:: GYJE with [employer] at [phone]  Co-Pay: $47.00 estimated copay for 30 day supply  Prior Approval: No  Deductible: (Deductible does not apply.)   Dannette Barbara Phone Number: [phone] or [phone] 05/16/2019, 10:02 AM

## 2019-05-16 NOTE — Progress Notes (Signed)
Patient ID: Joshua Sampson  The patient was admitted to [facility] on 05/06/2019 and discharged on 05/16/2019.  Please excuse from work through 05/20/2019.  Can go back to work on 05/21/2019.  Dr Loletha Grayer [facility]

## 2019-05-16 NOTE — Plan of Care (Signed)
  Problem: Clinical Measurements: Goal: Will remain free from infection Outcome: Adequate for Discharge   Problem: Education: Goal: Knowledge of disease or condition will improve Outcome: Adequate for Discharge   Problem: Education: Goal: Understanding of medication regimen will improve Outcome: Adequate for Discharge   Problem: Clinical Measurements: Goal: Ability to maintain clinical measurements within normal limits will improve Outcome: Adequate for Discharge

## 2020-03-30 ENCOUNTER — Ambulatory Visit (INDEPENDENT_AMBULATORY_CARE_PROVIDER_SITE_OTHER): Payer: BC Managed Care – PPO

## 2020-03-30 ENCOUNTER — Encounter: Payer: Self-pay | Admitting: Emergency Medicine

## 2020-03-30 ENCOUNTER — Other Ambulatory Visit: Payer: Self-pay

## 2020-03-30 ENCOUNTER — Ambulatory Visit
Admission: EM | Admit: 2020-03-30 | Discharge: 2020-03-30 | Disposition: A | Discharge status: Home / Self Care | Payer: BC Managed Care – PPO

## 2020-03-30 DIAGNOSIS — M79672 Pain in left foot: Secondary | ICD-10-CM | POA: Diagnosis not present

## 2020-03-30 DIAGNOSIS — B353 Tinea pedis: Secondary | ICD-10-CM | POA: Diagnosis not present

## 2020-03-30 DIAGNOSIS — L03116 Cellulitis of left lower limb: Secondary | ICD-10-CM

## 2020-03-30 HISTORY — DX: Type 2 diabetes mellitus without complications: E11.9

## 2020-03-30 MED ORDER — DOXYCYCLINE HYCLATE 100 MG PO CAPS: 100.0000 mg | ORAL_CAPSULE | Freq: Two times a day (BID) | ORAL | 0 refills | Status: AC

## 2020-03-30 MED ORDER — KETOCONAZOLE 2 % EX CREA: 1.0000 "application " | TOPICAL_CREAM | Freq: Every day | CUTANEOUS | 0 refills | Status: AC

## 2020-03-30 MED ORDER — MUPIROCIN 2 % EX OINT: 1.0000 "application " | TOPICAL_OINTMENT | Freq: Three times a day (TID) | CUTANEOUS | 0 refills | Status: AC

## 2020-03-30 NOTE — ED Triage Notes (Signed)
Patient c/o left foot pain and swelling that started yesterday.  Patient has some redness on top of his left foot.  Patient denies injury or fall.

## 2020-03-30 NOTE — Discharge Instructions (Addendum)
The antibiotic that I placed you on may interfere with your warfarin.  Please notify the provider that adjust your warfarin of this antibiotic use.  Recommend following up with a podiatrist if the infection worsens or if you cannot get the fungal infection under control.

## 2020-03-30 NOTE — ED Provider Notes (Signed)
[facility]    CSN: [id number] Arrival date & time: 03/30/20  1030      History   Chief Complaint Chief Complaint  Patient presents with  . Foot Pain    left    HPI Joshua Sampson is a 44 y.o. Sampson.   HPI   44 year old Sampson morbidly obese resents with a 1 day history of left foot pain and swelling.  He has noticed redness on the top of the foot particularly the first and second metatarsals distally.  He has had no injury.  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  He denies any fever or chills.      Past Medical History:  Diagnosis Date  . Diabetes mellitus without complication (HCC)   . Hypertension   . Obesity     Patient Active Problem List   Diagnosis Date Noted  . Atrial fibrillation with RVR (HCC)   . Acute diastolic CHF (congestive heart failure) (HCC)   . Acute systolic CHF (congestive heart failure) (HCC) 05/13/2019  . UTI (urinary tract infection) 05/13/2019  . Acute on chronic respiratory failure (HCC)   . Acute respiratory failure with hypoxia (HCC)   . Atrial flutter (HCC)   . Hypotension 05/10/2019  . Chest pain 05/09/2019  . Elevated troponin 05/09/2019  . 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 Morbid obesity (HCC)   . Hypertension   . Atrial flutter with rapid ventricular response (HCC)   . Headache     Past Surgical History:  Procedure Laterality Date  . CHOLECYSTECTOMY         Home Medications    Prior to Admission medications   Medication Sig Start Date End Date Taking? Authorizing Provider  diltiazem [facility]) 240 MG 24 hr capsule Take by mouth. 10/29/19 10/28/20 Yes [provider]  losartan (COZAAR) 25 MG tablet Take by mouth. 02/20/20  Yes [provider]  Multiple Vitamin (MULTIVITAMIN WITH MINERALS) TABS tablet Take 1 tablet by mouth daily.   Yes [provider]  spironolactone (ALDACTONE) 25 MG tablet  Take by mouth. 01/09/20 01/08/21 Yes [provider]  torsemide (DEMADEX) 20 MG tablet Take by mouth. 01/09/20 04/08/20 Yes [provider]  warfarin (COUMADIN) 4 MG tablet Take by mouth. 02/26/20 02/25/21 Yes [provider]  amiodarone (PACERONE) 200 MG tablet Two pills twice a day for one week then follow cardiologist instructions after that. 05/16/19   Alford Highland, MD  diltiazem (CARDIZEM CD) 180 MG 24 hr capsule Take 1 capsule (180 mg total) by mouth daily. 05/16/19   Alford Highland, MD  doxycycline (VIBRAMYCIN) 100 MG capsule Take 1 capsule (100 mg total) by mouth 2 (two) times daily. 03/30/20   Lutricia Feil, PA-C  ketoconazole (NIZORAL) 2 % cream Apply 1 application topically daily. 03/30/20   Lutricia Feil, PA-C  mupirocin ointment (BACTROBAN) 2 % Apply 1 application topically 3 (three) times daily. 03/30/20   Lutricia Feil, PA-C  apixaban (ELIQUIS) 5 MG TABS tablet Take 1 tablet (5 mg total) by mouth 2 (two) times daily. 05/16/19 03/30/20  Alford Highland, MD  hydrochlorothiazide (HYDRODIURIL) 25 MG tablet TAKE 1 TABLET BY MOUTH DAILY 08/30/18 03/30/20  [provider]    Family History Family History  Problem Relation Age of Onset  . Hypertension Mother   . Hypertension Father   . Stroke Brother     Social History  Social History   Tobacco Use  . Smoking status: Never Smoker  . Smokeless tobacco: Never Used  Vaping Use  . Vaping Use: Never used  Substance Use Topics  . Alcohol use: Not Currently  . Drug use: Never     Allergies   Patient has no known allergies.   Review of Systems Review of Systems  Constitutional: Positive for activity change. Negative for appetite change, chills, fatigue and fever.  Musculoskeletal: Positive for gait problem and myalgias.  Skin: Positive for color change and wound.  All other systems reviewed and are negative.    Physical Exam Triage Vital Signs ED Triage Vitals  Enc Vitals Group       BP 03/30/20 1119 (!) 121/95     Pulse Rate 03/30/20 1119 85     Resp 03/30/20 1119 17     Temp 03/30/20 1119 98.2 F (36.8 C)     Temp Source 03/30/20 1119 Oral     SpO2 03/30/20 1119 97 %     Weight 03/30/20 1115 (!) 445 lb (201.9 kg)     Height 03/30/20 1115 5\' 11"  (1.803 m)     Head Circumference --      Peak Flow --      Pain Score 03/30/20 1115 8     Pain Loc --      Pain Edu? --      Excl. in GC? --    No data found.  Updated Vital Signs BP (!) 121/95 (BP Location: Right Arm)   Pulse 85   Temp 98.2 F (36.8 C) (Oral)   Resp 17   Ht 5\' 11"  (1.803 m)   Wt (!) 445 lb (201.9 kg)   SpO2 97%   BMI 62.06 kg/m   Visual Acuity Right Eye Distance:   Left Eye Distance:   Bilateral Distance:    Right Eye Near:   Left Eye Near:    Bilateral Near:     Physical Exam Vitals and nursing note reviewed.  Constitutional:      General: He is not in acute distress.    Appearance: Normal appearance. He is obese. He is not ill-appearing or toxic-appearing.  HENT:     Head: Normocephalic and atraumatic.  Eyes:     Conjunctiva/sclera: Conjunctivae normal.  Musculoskeletal:        General: Normal range of motion.     Cervical back: Normal range of motion and neck supple.     Comments: Examination of the left foot mild swelling present.  There is erythema warmth and blanching of the skin over the distal first and second metatarsals.  Tenderness is localized over the distal first metatarsal.  Interdigital inspection shows a fissure in the webspace from fungus.  Skin:    General: Skin is warm and dry.  Neurological:     General: No focal deficit present.     Mental Status: He is alert and oriented to person, place, and time.  Psychiatric:        Mood and Affect: Mood normal.        Behavior: Behavior normal.        Thought Content: Thought content normal.        Judgment: Judgment normal.      UC Treatments / Results  Labs (all labs ordered are listed, but only  abnormal results are displayed) Labs Reviewed - No data to display  EKG   Radiology DG Foot Complete Left  Result Date: 03/30/2020 CLINICAL DATA:  Left  foot pain at the first and second metatarsals EXAM: LEFT FOOT - COMPLETE 3+ VIEW COMPARISON:  None. FINDINGS: Subcutaneous reticulation the level of the ankle and foot. No evidence of fracture, erosion, or bone lesion. Mild first MTP joint space narrowing. Dorsal midfoot spurring. IMPRESSION: Soft tissue swelling without fracture or erosion. Electronically Signed   By: Marnee Spring M.D.   On: 03/30/2020 13:08    Procedures Procedures (including critical care time)  Medications Ordered in UC Medications - No data to display  Initial Impression / Assessment and Plan / UC Course  I have reviewed the triage vital signs and the nursing notes.  Pertinent labs & imaging results that were available during my care of the patient were reviewed by me and considered in my medical decision making (see chart for details).   44 year old Sampson who is morbidly obese resented with 2-day history of redness tenderness warmth over the distal first and second metatarsal area.  Does not remember any injury or fall.  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  Examination showed blanching erythema with tenderness over the first metatarsal distally near the MTP joint.  Less so over the second MTP and metatarsal.  He had fungus interdigitally in the webspace may have caused the cellulitis.  X-rays were obtained showing no fracture or dislocation but did confirm the soft tissue swelling.  I will treat the cellulitis with doxycycline and mupirocin ointment.  Instructions were given clearly to the patient.  In addition I have started him on Nizoral cream to control the interdigital fungal infection.  I recommended that he follow-up with the podiatrist for ongoing care of the fungal infection as well as the cellulitis if it does not  respond in several days.  He may also return to our clinic at any time   Final Clinical Impressions(s) / UC Diagnoses   Final diagnoses:  Cellulitis of left lower extremity  Tinea pedis of left foot     Discharge Instructions     The antibiotic that I placed you on may interfere with your warfarin.  Please notify the provider that adjust your warfarin of this antibiotic use.  Recommend following up with a podiatrist if the infection worsens or if you cannot get the fungal infection under control.    ED Prescriptions    Medication Sig Dispense Auth. Provider   mupirocin ointment (BACTROBAN) 2 % Apply 1 application topically 3 (three) times daily. 22 g Ovid Curd P, PA-C   ketoconazole (NIZORAL) 2 % cream Apply 1 application topically daily. 15 g Ovid Curd P, PA-C   doxycycline (VIBRAMYCIN) 100 MG capsule Take 1 capsule (100 mg total) by mouth 2 (two) times daily. 10 capsule Lutricia Feil, PA-C     PDMP not reviewed this encounter.   Lutricia Feil, PA-C 03/30/20 [phone]

## 2020-04-03 ENCOUNTER — Ambulatory Visit
Admission: EM | Admit: 2020-04-03 | Discharge: 2020-04-03 | Disposition: A | Discharge status: Home / Self Care | Payer: BC Managed Care – PPO | Attending: Internal Medicine | Admitting: Internal Medicine

## 2020-04-03 ENCOUNTER — Encounter: Payer: Self-pay | Admitting: Emergency Medicine

## 2020-04-03 ENCOUNTER — Other Ambulatory Visit: Payer: Self-pay

## 2020-04-03 DIAGNOSIS — L03032 Cellulitis of left toe: Secondary | ICD-10-CM | POA: Diagnosis not present

## 2020-04-03 DIAGNOSIS — M109 Gout, unspecified: Secondary | ICD-10-CM | POA: Diagnosis not present

## 2020-04-03 LAB — CBC WITH DIFFERENTIAL/PLATELET
Abs Immature Granulocytes: 0.01 10*3/uL (ref 0.00–0.07)
Basophils Absolute: 0 10*3/uL (ref 0.0–0.1)
Basophils Relative: 0 %
Eosinophils Absolute: 0.1 10*3/uL (ref 0.0–0.5)
Eosinophils Relative: 1 %
HCT: 50.1 % (ref 39.0–52.0)
Hemoglobin: 16.3 g/dL (ref 13.0–17.0)
Immature Granulocytes: 0 %
Lymphocytes Relative: 37 %
Lymphs Abs: 2.2 10*3/uL (ref 0.7–4.0)
MCH: 30.2 pg (ref 26.0–34.0)
MCHC: 32.5 g/dL (ref 30.0–36.0)
MCV: 92.8 fL (ref 80.0–100.0)
Monocytes Absolute: 0.4 10*3/uL (ref 0.1–1.0)
Monocytes Relative: 7 %
Neutro Abs: 3.3 10*3/uL (ref 1.7–7.7)
Neutrophils Relative %: 55 %
Platelets: 243 10*3/uL (ref 150–400)
RBC: 5.4 MIL/uL (ref 4.22–5.81)
RDW: 12.2 % (ref 11.5–15.5)
WBC: 6 10*3/uL (ref 4.0–10.5)
nRBC: 0 % (ref 0.0–0.2)

## 2020-04-03 LAB — URIC ACID: Uric Acid, Serum: 8.9 mg/dL — ABNORMAL HIGH (ref 3.7–8.6)

## 2020-04-03 MED ORDER — CLINDAMYCIN HCL 300 MG PO CAPS: 300.0000 mg | ORAL_CAPSULE | Freq: Four times a day (QID) | ORAL | 0 refills | Status: AC

## 2020-04-03 MED ORDER — HYDROCODONE-ACETAMINOPHEN 5-325 MG PO TABS: 1.0000 | ORAL_TABLET | ORAL | 0 refills | Status: AC | PRN

## 2020-04-03 MED ORDER — PREDNISONE 10 MG (21) PO TBPK: ORAL_TABLET | ORAL | 0 refills | Status: AC

## 2020-04-03 NOTE — ED Triage Notes (Signed)
Pt c/o left foot pain. He was seen about 4 days ago and treated for cellulitis and tinea pedis. He states he is not much better and he will run out of his medication today. Pt  And redness is mainly at the joint of the great toe.

## 2020-04-03 NOTE — Discharge Instructions (Addendum)
To cover the possible infection I am placing you on clindamycin instead of the Doxy. Take the prednisone as directed, and while on this dont take Ibuprofen.

## 2020-04-03 NOTE — ED Provider Notes (Addendum)
[facility]    CSN: [id number] Arrival date & time: 04/03/20  1351      History   Chief Complaint Chief Complaint  Patient presents with  . Foot Pain    left    HPI Joshua Sampson is a 44 y.o. male who presents with worse L foot pain. Was seen 5 days ago for cellulitis and possible fungus between his toes and has been taking Doxy and using the antifungal cream, but his foot feels more painful, and is more swollen and red.     Past Medical History:  Diagnosis Date  . Diabetes mellitus without complication (HCC)   . Hypertension   . Obesity     Patient Active Problem List   Diagnosis Date Noted  . Atrial fibrillation with RVR (HCC)   . Acute diastolic CHF (congestive heart failure) (HCC)   . Acute systolic CHF (congestive heart failure) (HCC) 05/13/2019  . UTI (urinary tract infection) 05/13/2019  . Acute on chronic respiratory failure (HCC)   . Acute respiratory failure with hypoxia (HCC)   . Atrial flutter (HCC)   . Hypotension 05/10/2019  . Chest pain 05/09/2019  . Elevated troponin 05/09/2019  . 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 Morbid obesity (HCC)   . Hypertension   . Atrial flutter with rapid ventricular response (HCC)   . Headache     Past Surgical History:  Procedure Laterality Date  . CHOLECYSTECTOMY         Home Medications    Prior to Admission medications   Medication Sig Start Date End Date Taking? Authorizing Provider  amiodarone (PACERONE) 200 MG tablet Two pills twice a day for one week then follow cardiologist instructions after that. 05/16/19  Yes Renae Gloss, Richard, MD  diltiazem (CARDIZEM CD) 180 MG 24 hr capsule Take 1 capsule (180 mg total) by mouth daily. 05/16/19  Yes Wieting, Gerlene Burdock, MD  diltiazem [facility]) 240 MG 24 hr capsule Take by mouth. 10/29/19 10/28/20 Yes [provider]  doxycycline (VIBRAMYCIN) 100 MG capsule Take 1 capsule (100 mg total) by mouth 2  (two) times daily. 03/30/20  Yes Lutricia Feil, PA-C  ketoconazole (NIZORAL) 2 % cream Apply 1 application topically daily. 03/30/20  Yes Lutricia Feil, PA-C  losartan (COZAAR) 25 MG tablet Take by mouth. 02/20/20  Yes [provider]  Multiple Vitamin (MULTIVITAMIN WITH MINERALS) TABS tablet Take 1 tablet by mouth daily.   Yes [provider]  mupirocin ointment (BACTROBAN) 2 % Apply 1 application topically 3 (three) times daily. 03/30/20  Yes Lutricia Feil, PA-C  spironolactone (ALDACTONE) 25 MG tablet Take by mouth. 01/09/20 01/08/21 Yes [provider]  torsemide (DEMADEX) 20 MG tablet Take by mouth. 01/09/20 04/08/20 Yes [provider]  warfarin (COUMADIN) 4 MG tablet Take by mouth. 02/26/20 02/25/21 Yes [provider]  clindamycin (CLEOCIN) 300 MG capsule Take 1 capsule (300 mg total) by mouth 4 (four) times daily for 7 days. 04/03/20 04/10/20  Rodriguez-Southworth, Nettie Elm, PA-C  HYDROcodone-acetaminophen (NORCO/VICODIN) 5-325 MG tablet Take 1 tablet by mouth every 4 (four) hours as needed. 04/03/20   Rodriguez-Southworth, Nettie Elm, PA-C  predniSONE (STERAPRED UNI-PAK 21 TAB) 10 MG (21) TBPK tablet Start with 6 today and taper off one tablet per day til gone 04/03/20   Rodriguez-Southworth, Nettie Elm, PA-C  apixaban (ELIQUIS) 5 MG TABS tablet Take 1 tablet (5 mg total) by mouth 2 (two) times daily. 05/16/19 03/30/20  Alford Highland, MD  hydrochlorothiazide (HYDRODIURIL) 25 MG tablet TAKE 1 TABLET BY MOUTH DAILY 08/30/18 03/30/20  [provider]    Family History Family History  Problem Relation Age of Onset  . Hypertension Mother   . Hypertension Father   . Stroke Brother     Social History Social History   Tobacco Use  . Smoking status: Never Smoker  . Smokeless tobacco: Never Used  Vaping Use  . Vaping Use: Never used  Substance Use Topics  . Alcohol use: Not Currently  . Drug use: Never     Allergies   Patient has no known  allergies.   Review of Systems Review of Systems  Constitutional: Negative for chills, diaphoresis and fever.  Musculoskeletal: Positive for arthralgias, gait problem and joint swelling.  Skin: Positive for pallor. Negative for rash.     Physical Exam Triage Vital Signs ED Triage Vitals  Enc Vitals Group     BP 04/03/20 1427 (!) 154/104     Pulse Rate 04/03/20 1427 74     Resp 04/03/20 1427 18     Temp 04/03/20 1427 98.7 F (37.1 C)     Temp Source 04/03/20 1427 Oral     SpO2 04/03/20 1427 98 %     Weight 04/03/20 1425 (!) 445 lb 1.7 oz (201.9 kg)     Height 04/03/20 1425 5\' 11"  (1.803 m)     Head Circumference --      Peak Flow --      Pain Score 04/03/20 1424 7     Pain Loc --      Pain Edu? --      Excl. in GC? --    No data found.  Updated Vital Signs BP (!) 154/104 (BP Location: Left Arm)   Pulse 74   Temp 98.7 F (37.1 C) (Oral)   Resp 18   Ht 5\' 11"  (1.803 m)   Wt (!) 445 lb 1.7 oz (201.9 kg)   SpO2 98%   BMI 62.08 kg/m   Visual Acuity Right Eye Distance:   Left Eye Distance:   Bilateral Distance:    Right Eye Near:   Left Eye Near:    Bilateral Near:     Physical Exam Vitals and nursing note reviewed.  Constitutional:      General: He is not in acute distress.    Appearance: He is not toxic-appearing.  HENT:     Head: Normocephalic.     Right Ear: External ear normal.     Left Ear: External ear normal.  Eyes:     General: No scleral icterus.    Conjunctiva/sclera: Conjunctivae normal.  Cardiovascular:     Pulses: Normal pulses.  Musculoskeletal:     Comments: Has severe pain to palpation of his L 1st distal metatarsal region and proximal great toe.   Skin:    General: Skin is dry.     Findings: No rash.     Comments: Has mild cracking and maceration between his L great toe and second toes. Has moderate swelling of dorsal foot, and hot and erythema over proximal great toe and metatarsals. Toes are not tender  Neurological:     Mental  Status: He is oriented to person, place, and time.     Gait: Gait normal.  Psychiatric:        Mood and Affect: Mood normal.        Behavior: Behavior normal.        Thought Content: Thought content normal.  Judgment: Judgment normal.      UC Treatments / Results  Labs (all labs ordered are listed, but only abnormal results are displayed) Labs Reviewed  URIC ACID - Abnormal; Notable for the following components:      Result Value   Uric Acid, Serum 8.9 (*)    All other components within normal limits  CBC WITH DIFFERENTIAL/PLATELET    EKG   Radiology No results found.  Procedures Procedures (including critical care time)  Medications Ordered in UC Medications - No data to display  Initial Impression / Assessment and Plan / UC Course  I have reviewed the triage vital signs and the nursing notes. Pertinent labs results that were available during my care of the patient were reviewed by me and considered in my medical decision making (see chart for details). Uric acid is slightly elevated and CBC is normal.  I placed him on prednisone and changed added Clindamycin to start tomorrow. He takes his last Doxy dose tonight.  I also gave him norco for pain.  Final Clinical Impressions(s) / UC Diagnoses   Final diagnoses:  Acute gout of left foot, unspecified cause  Cellulitis of toe of left foot     Discharge Instructions     To cover the possible infection I am placing you on clindamycin instead of the Doxy. Take the prednisone as directed, and while on this dont take Ibuprofen.     ED Prescriptions    Medication Sig Dispense Auth. Provider   predniSONE (STERAPRED UNI-PAK 21 TAB) 10 MG (21) TBPK tablet Start with 6 today and taper off one tablet per day til gone 21 tablet Rodriguez-Southworth, Nettie Elm, PA-C   clindamycin (CLEOCIN) 300 MG capsule Take 1 capsule (300 mg total) by mouth 4 (four) times daily for 7 days. 28 capsule Rodriguez-Southworth, Nettie Elm, PA-C     HYDROcodone-acetaminophen (NORCO/VICODIN) 5-325 MG tablet Take 1 tablet by mouth every 4 (four) hours as needed. 10 tablet Rodriguez-Southworth, Nettie Elm, PA-C     I have reviewed the PDMP during this encounter.   Garey Ham, PA-C 04/03/20 1639    Rodriguez-Southworth, Nettie Elm, PA-C 04/03/20 1641

## 2020-09-30 ENCOUNTER — Ambulatory Visit
Admission: EM | Admit: 2020-09-30 | Discharge: 2020-09-30 | Disposition: A | Discharge status: Home / Self Care | Payer: BC Managed Care – PPO | Attending: Family Medicine | Admitting: Family Medicine

## 2020-09-30 ENCOUNTER — Encounter: Payer: Self-pay | Admitting: Emergency Medicine

## 2020-09-30 ENCOUNTER — Other Ambulatory Visit: Payer: Self-pay

## 2020-09-30 DIAGNOSIS — I4811 Longstanding persistent atrial fibrillation: Secondary | ICD-10-CM

## 2020-09-30 DIAGNOSIS — R079 Chest pain, unspecified: Secondary | ICD-10-CM

## 2020-09-30 DIAGNOSIS — I5022 Chronic systolic (congestive) heart failure: Secondary | ICD-10-CM | POA: Diagnosis not present

## 2020-09-30 NOTE — ED Notes (Signed)
Patient is being discharged from the Urgent Care and sent to the Emergency Department via private vehicle, declined EMS, AMA. Per Eusebio Friendly, PA, patient is in need of higher level of care due to cardiac history and chest pain. Patient is aware and verbalizes understanding of plan of care.  Vitals:   09/30/20 1751  BP: (!) 143/102  Pulse: 98  Resp: 20  Temp: 97.8 F (36.6 C)  SpO2: 99%

## 2020-09-30 NOTE — ED Provider Notes (Signed)
[facility]    CSN: [id number] Arrival date & time: 09/30/20  1739      History   Chief Complaint Chief Complaint  Patient presents with  . Chest Pain    HPI Joshua Sampson is a 45 y.o. male presenting for central "chest discomfort" x1 hour.  Patient says that it feels like "acid reflux."  Pain does not radiate.  Pain is not associated with dizziness, weakness, nausea/vomiting, headaches, fatigue, syncope or presyncope, palpitations, shortness of breath or breathing difficulty, abdominal pain.  Patient has not taken anything for pain relief.  He says that the pain is a little bit better than it was at the onset.  He says that at the onset the pain is sharp.  He says that he was just sitting at his desk at work when the pain came on.  Patient has a complicated medical history.  He has morbid obesity with BMI >60, hypertension, OSA, chronic systolic congestive heart failure with 35% ejection fraction and CKD.  He also has history of longstanding persistent atrial fibrillation with RVR requiring warfarin and multiple rate controlling medications.  He takes losartan and diltiazem.  Patient also taking torsemide and spironolactone.  Patient admits to chronic peripheral edema and says that it is generally worse on the left side.  He has not noted any increase in the recent days.  He has follow-up closely with cardiology and saw his cardiologist 4 days ago.  He was advised to return in 3 months for lab work and ECHO.  Patient denies ever having any cardiac stress testing.  He denies ever having an AMI.  Last ECHO was in November 2021.   HPI  Past Medical History:  Diagnosis Date  . Diabetes mellitus without complication (HCC)   . Hypertension   . Obesity     Patient Active Problem List   Diagnosis Date Noted  . Atrial fibrillation with RVR (HCC)   . Acute diastolic CHF (congestive heart failure) (HCC)   . Acute systolic CHF (congestive heart failure) (HCC) 05/13/2019  . UTI  (urinary tract infection) 05/13/2019  . Acute on chronic respiratory failure (HCC)   . Acute respiratory failure with hypoxia (HCC)   . Atrial flutter (HCC)   . Hypotension 05/10/2019  . Chest pain 05/09/2019  . Elevated troponin 05/09/2019  . 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 Morbid obesity (HCC)   . Hypertension   . Atrial flutter with rapid ventricular response (HCC)   . Headache     Past Surgical History:  Procedure Laterality Date  . CHOLECYSTECTOMY         Home Medications    Prior to Admission medications   Medication Sig Start Date End Date Taking? Authorizing Provider  diltiazem (CARDIZEM CD) 180 MG 24 hr capsule Take 1 capsule (180 mg total) by mouth daily. 05/16/19  Yes Alford HighlandWieting, Richard, MD  diltiazem [facility](TIAZAC) 240 MG 24 hr capsule Take by mouth. 10/29/19 10/28/20 Yes [provider]  losartan (COZAAR) 25 MG tablet Take by mouth. 02/20/20  Yes [provider]  Multiple Vitamin (MULTIVITAMIN WITH MINERALS) TABS tablet Take 1 tablet by mouth daily.   Yes [provider]  torsemide (DEMADEX) 20 MG tablet Take by mouth. 01/09/20 09/30/20 Yes [provider]  warfarin (COUMADIN) 4 MG tablet Take by mouth. 02/26/20 02/25/21 Yes [provider]  amiodarone (PACERONE) 200 MG tablet Two pills twice a day for one week then follow cardiologist instructions  after that. 05/16/19   Alford Highland, MD  doxycycline (VIBRAMYCIN) 100 MG capsule Take 1 capsule (100 mg total) by mouth 2 (two) times daily. 03/30/20   Lutricia Feil, PA-C  HYDROcodone-acetaminophen (NORCO/VICODIN) 5-325 MG tablet Take 1 tablet by mouth every 4 (four) hours as needed. 04/03/20   Rodriguez-Southworth, Nettie Elm, PA-C  ketoconazole (NIZORAL) 2 % cream Apply 1 application topically daily. 03/30/20   Lutricia Feil, PA-C  mupirocin ointment (BACTROBAN) 2 % Apply 1 application topically 3 (three) times daily. 03/30/20    Lutricia Feil, PA-C  predniSONE (STERAPRED UNI-PAK 21 TAB) 10 MG (21) TBPK tablet Start with 6 today and taper off one tablet per day til gone 04/03/20   Rodriguez-Southworth, Nettie Elm, PA-C  spironolactone (ALDACTONE) 25 MG tablet Take by mouth. 01/09/20 01/08/21  [provider]  apixaban (ELIQUIS) 5 MG TABS tablet Take 1 tablet (5 mg total) by mouth 2 (two) times daily. 05/16/19 03/30/20  Alford Highland, MD  hydrochlorothiazide (HYDRODIURIL) 25 MG tablet TAKE 1 TABLET BY MOUTH DAILY 08/30/18 03/30/20  [provider]    Family History Family History  Problem Relation Age of Onset  . Hypertension Mother   . Hypertension Father   . Stroke Brother     Social History Social History   Tobacco Use  . Smoking status: Never Smoker  . Smokeless tobacco: Never Used  Vaping Use  . Vaping Use: Never used  Substance Use Topics  . Alcohol use: Not Currently  . Drug use: Never     Allergies   Patient has no known allergies.   Review of Systems Review of Systems  Constitutional: Negative for diaphoresis, fatigue and fever.  HENT: Negative for congestion.   Eyes: Negative for visual disturbance.  Respiratory: Negative for cough and shortness of breath.   Cardiovascular: Positive for chest pain and leg swelling. Negative for palpitations.  Gastrointestinal: Negative for abdominal pain, nausea and vomiting.  Neurological: Positive for headaches. Negative for dizziness, syncope, weakness, light-headedness and numbness.     Physical Exam Triage Vital Signs ED Triage Vitals  Enc Vitals Group     BP 09/30/20 1751 (!) 143/102     Pulse Rate 09/30/20 1751 98     Resp 09/30/20 1751 20     Temp 09/30/20 1751 97.8 F (36.6 C)     Temp Source 09/30/20 1751 Oral     SpO2 09/30/20 1751 99 %     Weight 09/30/20 1749 (!) 445 lb 1.7 oz (201.9 kg)     Height 09/30/20 1749 5\' 11"  (1.803 m)     Head Circumference --      Peak Flow --      Pain Score 09/30/20 1748 3     Pain  Loc --      Pain Edu? --      Excl. in GC? --    No data found.  Updated Vital Signs BP (!) 143/102 (BP Location: Right Arm)   Pulse 98   Temp 97.8 F (36.6 C) (Oral)   Resp 20   Ht 5\' 11"  (1.803 m)   Wt (!) 445 lb 1.7 oz (201.9 kg)   SpO2 99%   BMI 62.08 kg/m       Physical Exam Vitals and nursing note reviewed.  Constitutional:      General: He is not in acute distress.    Appearance: He is well-developed. He is obese. He is not ill-appearing or diaphoretic.  HENT:     Head: Normocephalic and  atraumatic.     Nose: Nose normal.     Mouth/Throat:     Mouth: Mucous membranes are moist.     Pharynx: Oropharynx is clear.  Eyes:     General: No scleral icterus.    Conjunctiva/sclera: Conjunctivae normal.  Cardiovascular:     Rate and Rhythm: Normal rate. Rhythm irregular.     Pulses: Normal pulses.     Heart sounds: Normal heart sounds.  Pulmonary:     Effort: Pulmonary effort is normal. No respiratory distress.     Breath sounds: Normal breath sounds.  Abdominal:     Palpations: Abdomen is soft.     Tenderness: There is no abdominal tenderness.  Musculoskeletal:     Cervical back: Neck supple.     Right lower leg: Edema (2+ bilateral pitting edema to mid tibia) present.     Left lower leg: Edema (2+ pitting edema to mid tibia) present.  Skin:    General: Skin is warm and dry.  Neurological:     General: No focal deficit present.     Mental Status: He is alert. Mental status is at baseline.     Motor: No weakness.     Gait: Gait normal.  Psychiatric:        Mood and Affect: Mood normal.        Behavior: Behavior normal.        Thought Content: Thought content normal.      UC Treatments / Results  Labs (all labs ordered are listed, but only abnormal results are displayed) Labs Reviewed - No data to display  EKG   Radiology No results found.  Procedures ED EKG  Date/Time: 09/30/2020 6:27 PM Performed by: Shirlee Latch, PA-C Authorized by:  Tommie Sams, DO   ECG reviewed by ED Physician in the absence of a cardiologist: yes   Interpretation:    Interpretation: abnormal   Rate:    ECG rate:  86   ECG rate assessment: normal   Rhythm:    Rhythm: atrial fibrillation   QRS:    QRS axis:  Normal   QRS intervals:  Normal   QRS conduction: normal   ST segments:    ST segments:  Normal T waves:    T waves: normal   Comments:     Atrial fibrillation with prolonged QT   (including critical care time)  Medications Ordered in UC Medications - No data to display  Initial Impression / Assessment and Plan / UC Course  I have reviewed the triage vital signs and the nursing notes.  Pertinent labs & imaging results that were available during my care of the patient were reviewed by me and considered in my medical decision making (see chart for details).   45 year old male presenting for chest pain without any other associated symptoms for 1 hour.  Patient has a very complicated medical history including chronic systolic heart failure and longstanding persistent atrial fibrillation with multiple medications needed to achieve rate control and anticoagulation with warfarin.  Also history of super morbid obesity and OSA.  Patient denies any history of AMI.  Last seen by cardiology 4 days ago and advised to return in 3 months for labs and ECHO.  Blood pressure is elevated at 143/102.  The remainder the vital signs are stable.  He is in no acute distress.  His heart is irregularly irregular but normal rate.  His chest is clear to auscultation.  He does have 2+ pitting edema to the  mid tibia.  EKG today shows atrial fibrillation and prolonged QT.  No ST or T wave abnormality noted.  Patient has a very complicated past medical history notes that an increased risk for AMI.  There is much concern for AMI given his current symptoms.  Also concern for acute on chronic CHF.  Advised that he needs immediate work-up in the emergency department.  I  have advised patient to go via EMS but he says that he does not want to do that.  He would like to go to [facility] ED in [place] since his physicians are through [facility].  He says no one can take him since his son does not get off work for several more hours.  He says that he can drive himself.  I have reviewed with him that that is a very high risk and he will need to sign an AMA if he is going to do so.  He has agreed to sign an AMA.  He is leaving in stable condition.   Final Clinical Impressions(s) / UC Diagnoses   Final diagnoses:  Chest pain, unspecified type  Chronic systolic heart failure (HCC)  Longstanding persistent atrial fibrillation [facility])     Discharge Instructions     You have been advised to follow up immediately in the emergency department for concerning signs.symptoms. If you declined EMS transport, please have a family member take you directly to the ED at this time. Do not delay. Based on concerns about condition, if you do not follow up in th e ED, you may risk poor outcomes including worsening of condition, delayed treatment and potentially life threatening issues. If you have declined to go to the ED at this time, you should call your PCP immediately to set up a follow up appointment.  Go to ED for red flag symptoms, including; fevers you cannot reduce with Tylenol/Motrin, severe headaches, vision changes, numbness/weakness in part of the body, lethargy, confusion, intractable vomiting, severe dehydration, chest pain, breathing difficulty, severe persistent abdominal or pelvic pain, signs of severe infection (increased redness, swelling of an area), feeling faint or passing out, dizziness, etc. You should especially go to the ED for sudden acute worsening of condition if you do not elect to go at this time.    ED Prescriptions    None     PDMP not reviewed this encounter.   Shirlee Latch, PA-C 09/30/20 2027

## 2020-09-30 NOTE — ED Triage Notes (Signed)
Pt c/o central chest pain. Started about an hour ago. Denies shortness of breath, nausea, sweats, dizziness. Has h/o multiple heart conditions.

## 2020-09-30 NOTE — Discharge Instructions (Addendum)

## 2020-11-19 IMAGING — DX DG ABDOMEN 1V
2 series · 2 of 2 positions shown · non-contrast
Comparison: None.

CLINICAL DATA: Abdominal distention

EXAM:
ABDOMEN - 1 VIEW

[abdomen supine (1 of 2)]
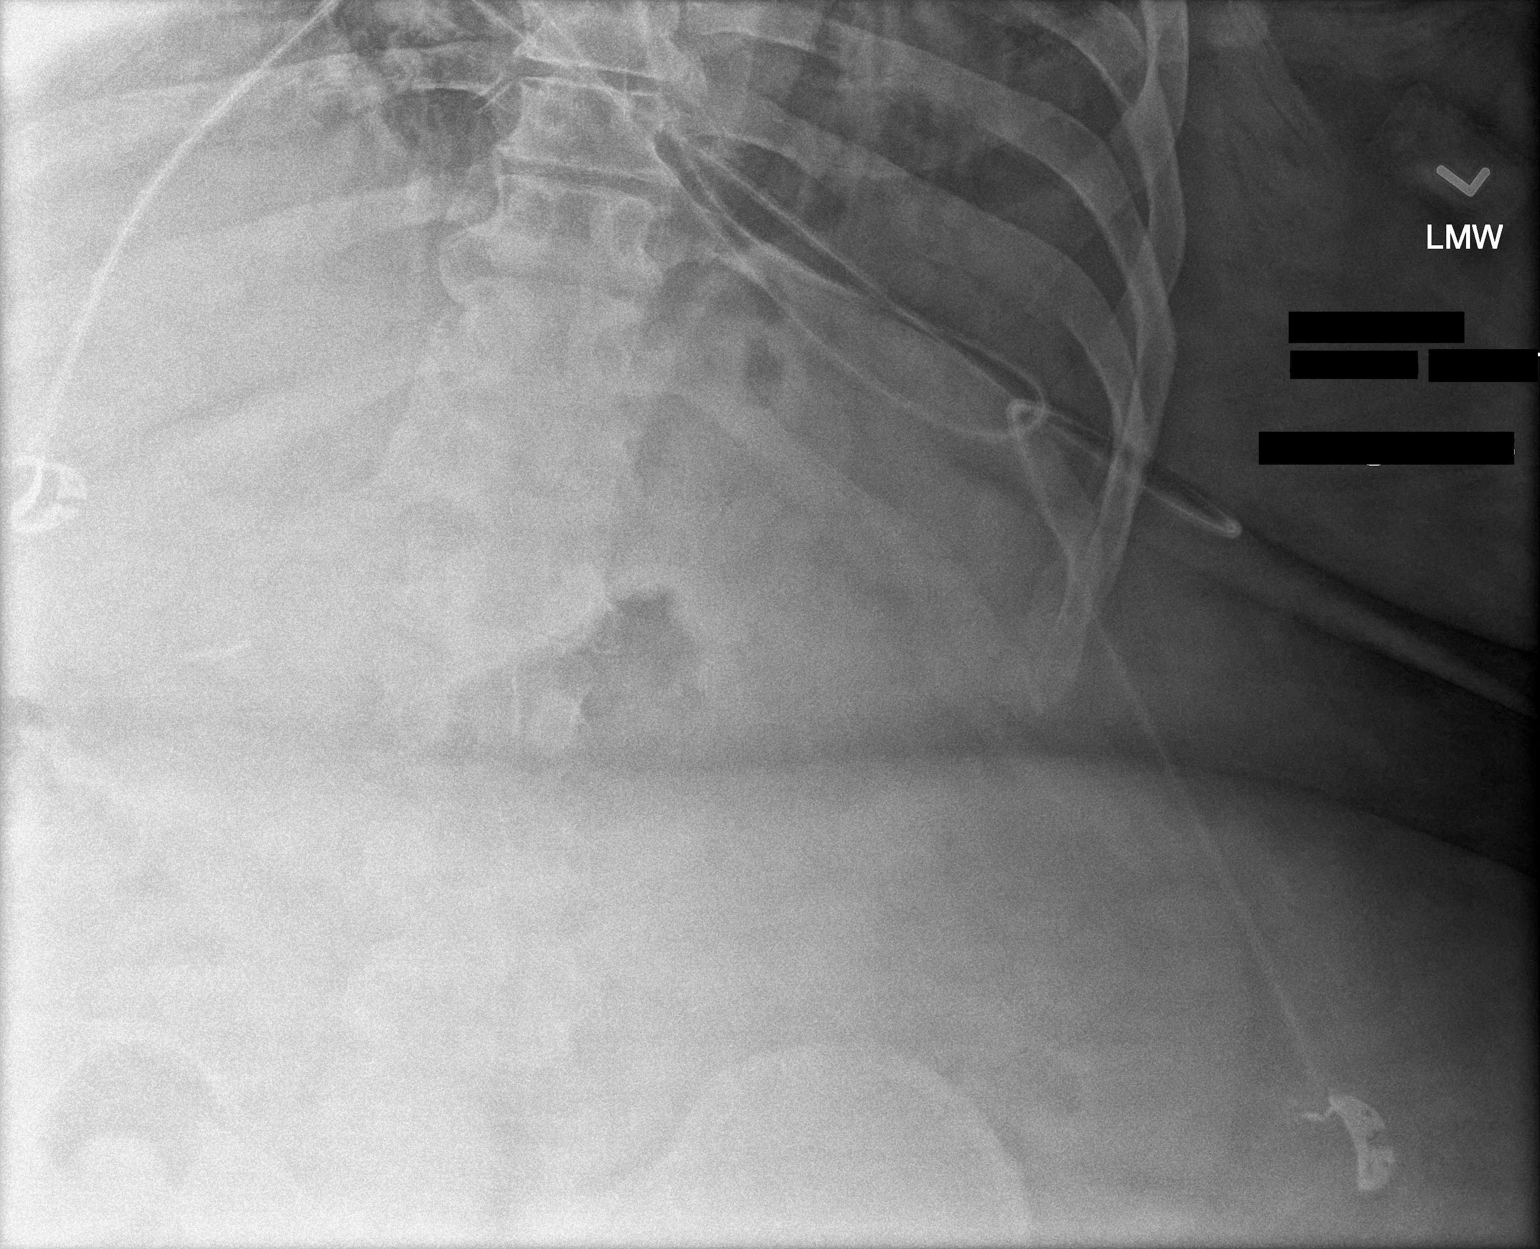

[abdomen supine (2 of 2)]
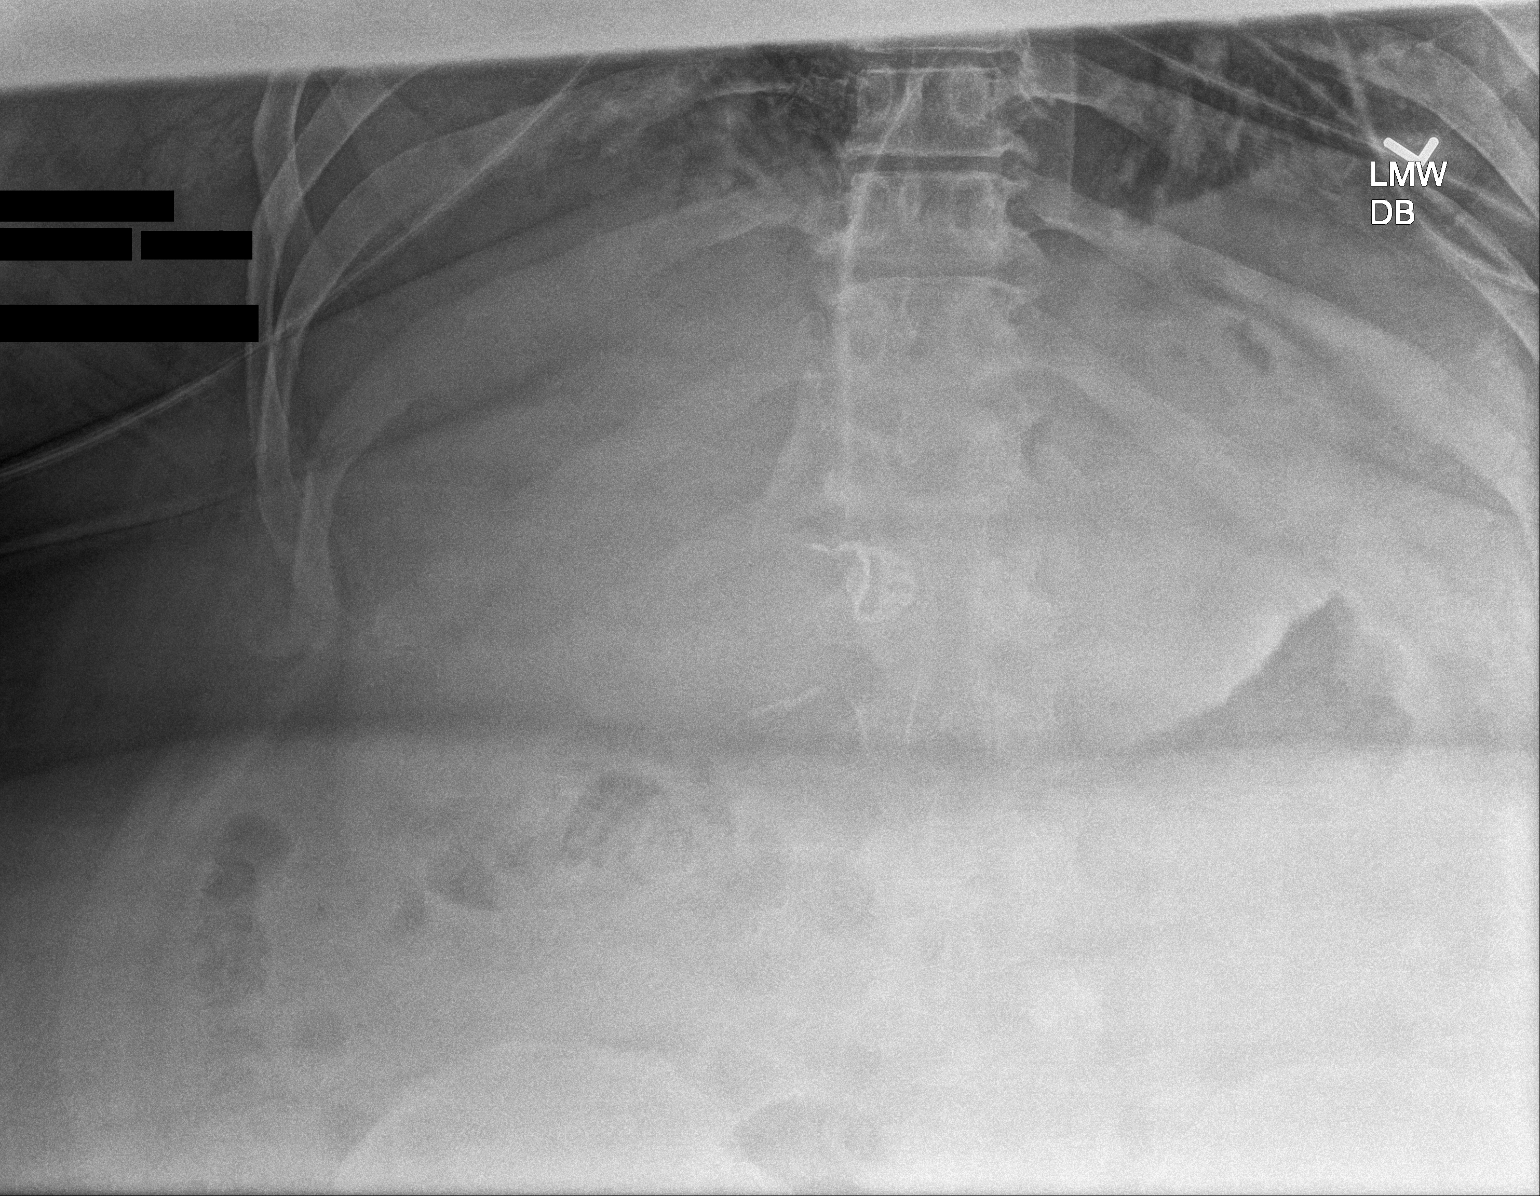

[2 of 2 positions shown; findings below may reference images not displayed]

FINDINGS: Nonobstructive bowel gas pattern.  No organomegaly or free air.
IMPRESSION: No acute findings.

## 2021-10-11 IMAGING — CR DG FOOT COMPLETE 3+V*L*
3 series · 3 of 3 positions shown · non-contrast
Comparison: None.

CLINICAL DATA: Left foot pain at the first and second metatarsals

EXAM:
LEFT FOOT - COMPLETE 3+ VIEW

[foot ap]
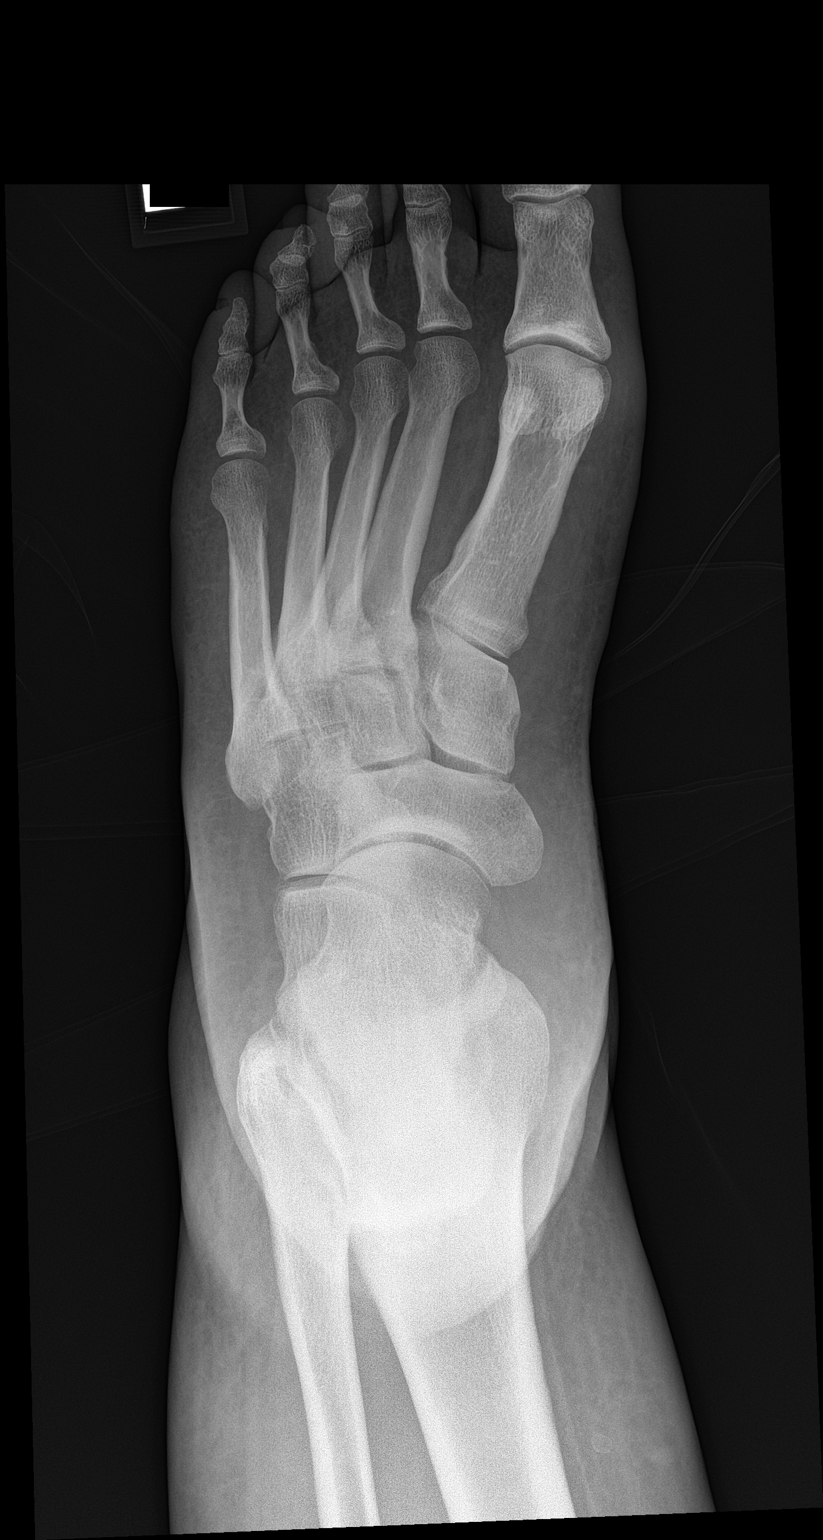

[foot obl]
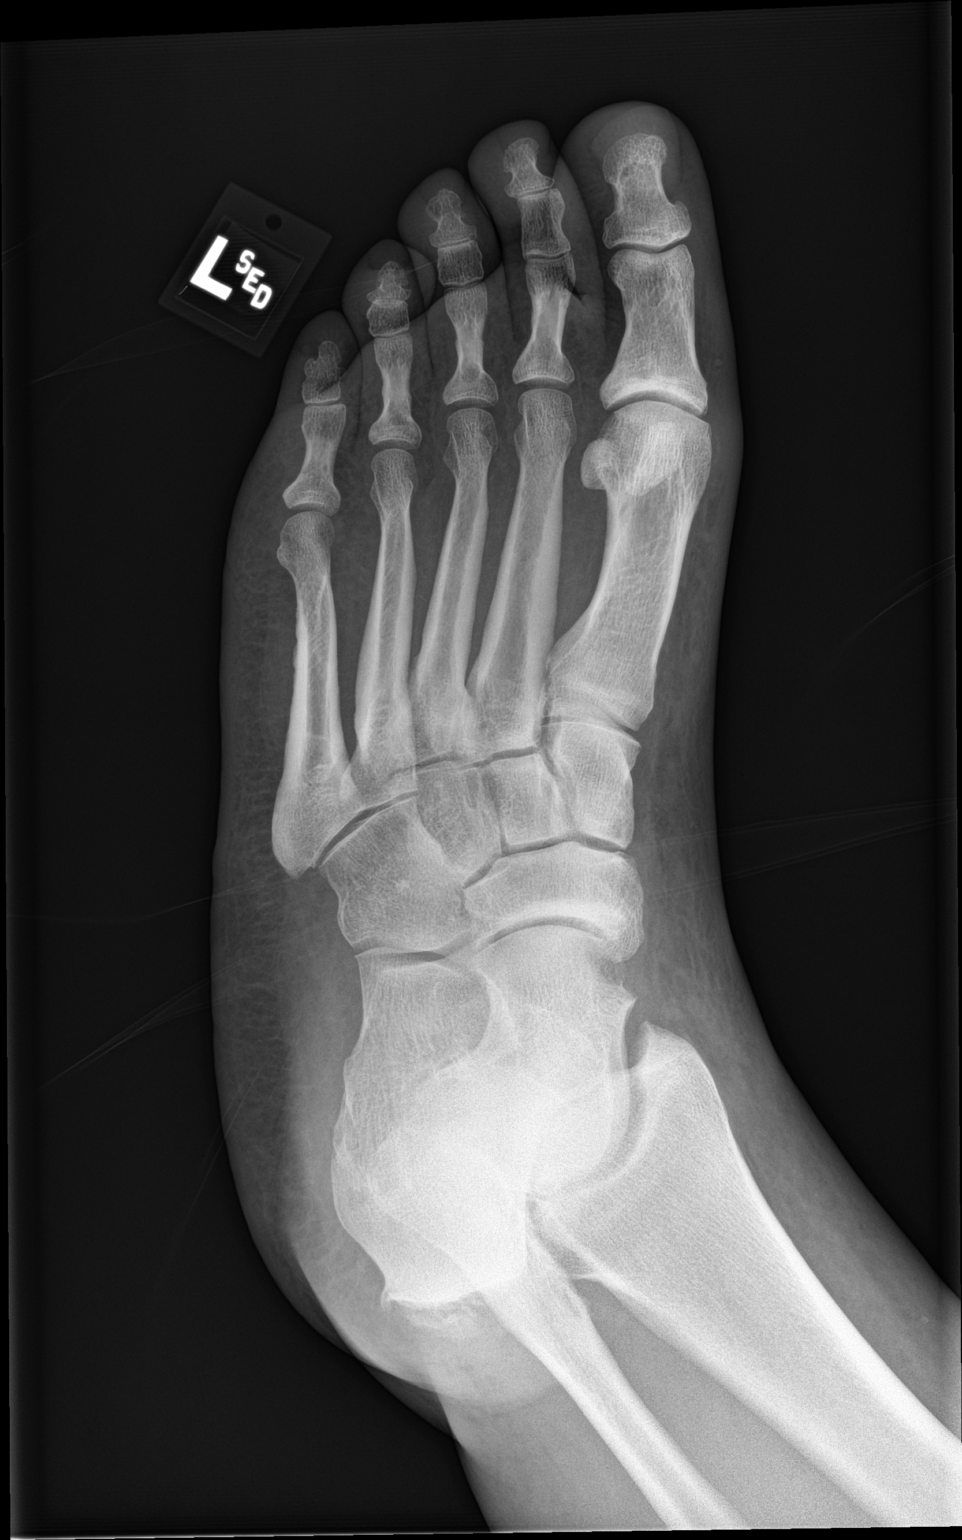

[foot lat]
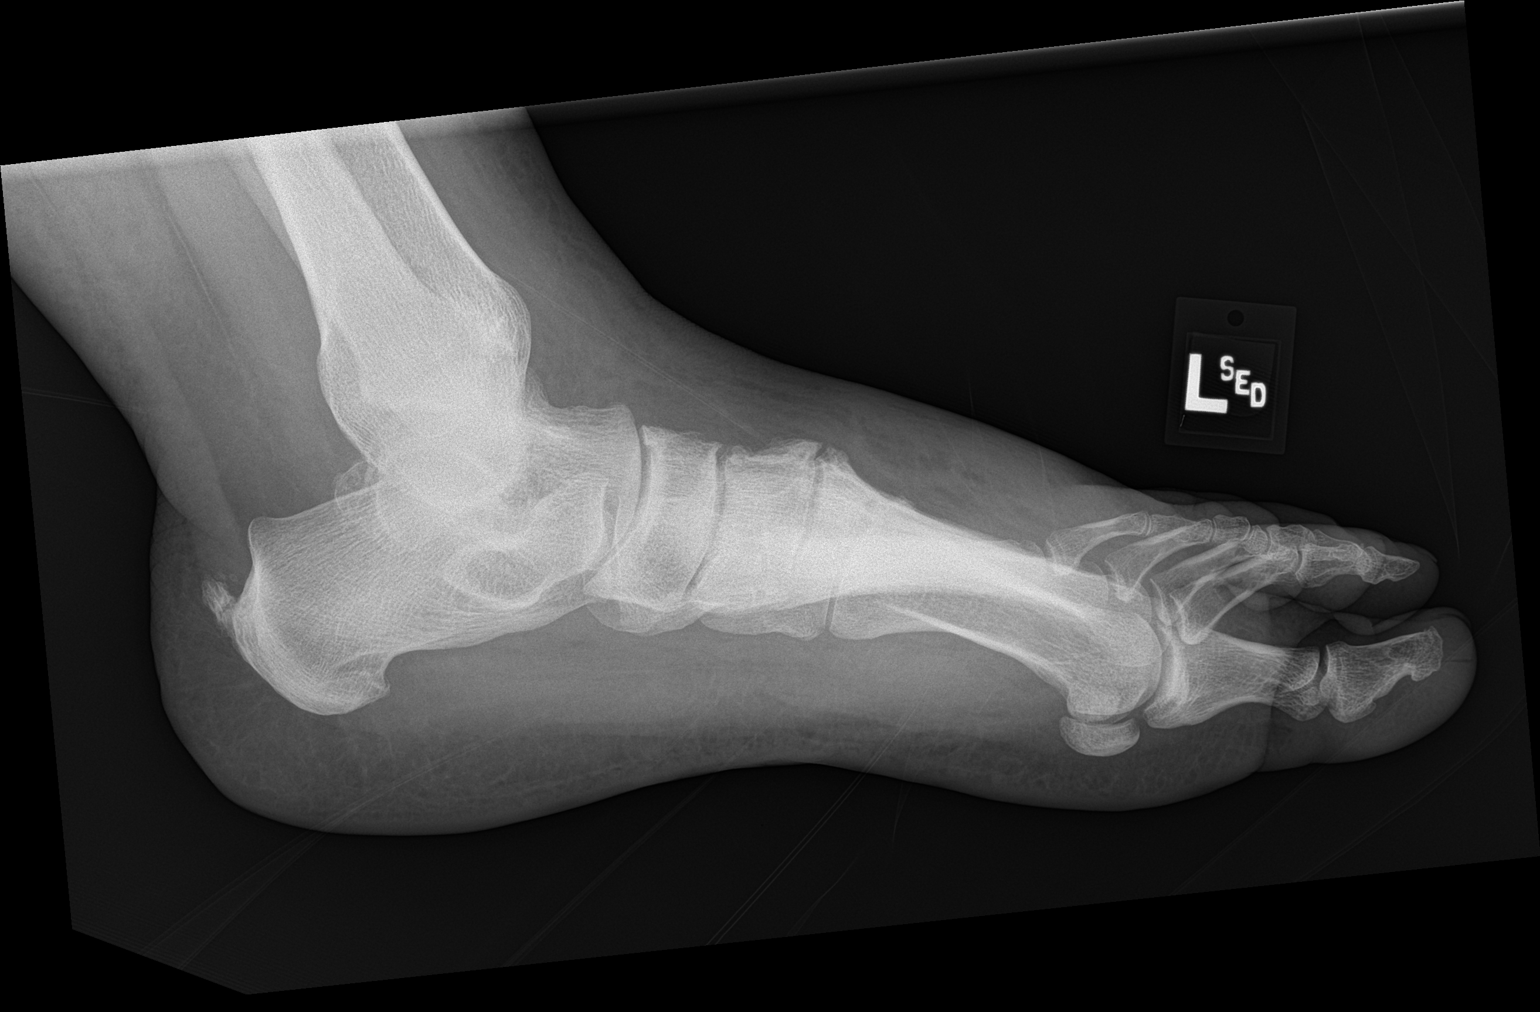

[3 of 3 positions shown; findings below may reference images not displayed]

FINDINGS: Subcutaneous reticulation the level of the ankle and foot.

No evidence of fracture, erosion, or bone lesion. Mild first MTP
joint space narrowing. Dorsal midfoot spurring.
IMPRESSION: Soft tissue swelling without fracture or erosion.
